# Patient Record
Sex: Male | Born: 1940 | Race: White | Hispanic: No | Marital: Married | Smoking: Never smoker
Health system: Southern US, Community
[De-identification: ages and names within clinical notes are randomized; demographics above are authoritative.]

## PROBLEM LIST (undated history)

## (undated) DIAGNOSIS — K219 Gastro-esophageal reflux disease without esophagitis: Secondary | ICD-10-CM

## (undated) DIAGNOSIS — I219 Acute myocardial infarction, unspecified: Secondary | ICD-10-CM

## (undated) DIAGNOSIS — D6861 Antiphospholipid syndrome: Secondary | ICD-10-CM

## (undated) DIAGNOSIS — I5042 Chronic combined systolic (congestive) and diastolic (congestive) heart failure: Secondary | ICD-10-CM

## (undated) DIAGNOSIS — M1990 Unspecified osteoarthritis, unspecified site: Secondary | ICD-10-CM

## (undated) DIAGNOSIS — R569 Unspecified convulsions: Secondary | ICD-10-CM

## (undated) DIAGNOSIS — I4891 Unspecified atrial fibrillation: Secondary | ICD-10-CM

## (undated) DIAGNOSIS — K5090 Crohn's disease, unspecified, without complications: Secondary | ICD-10-CM

## (undated) DIAGNOSIS — J45909 Unspecified asthma, uncomplicated: Secondary | ICD-10-CM

## (undated) DIAGNOSIS — I82409 Acute embolism and thrombosis of unspecified deep veins of unspecified lower extremity: Secondary | ICD-10-CM

## (undated) DIAGNOSIS — T7840XA Allergy, unspecified, initial encounter: Secondary | ICD-10-CM

## (undated) DIAGNOSIS — I10 Essential (primary) hypertension: Secondary | ICD-10-CM

## (undated) DIAGNOSIS — I639 Cerebral infarction, unspecified: Secondary | ICD-10-CM

## (undated) DIAGNOSIS — H269 Unspecified cataract: Secondary | ICD-10-CM

## (undated) HISTORY — DX: Unspecified atrial fibrillation: I48.91

## (undated) HISTORY — PX: EYE SURGERY: SHX253

## (undated) HISTORY — DX: Gastro-esophageal reflux disease without esophagitis: K21.9

## (undated) HISTORY — PX: SPINE SURGERY: SHX786

## (undated) HISTORY — DX: Unspecified cataract: H26.9

## (undated) HISTORY — PX: HERNIA REPAIR: SHX51

## (undated) HISTORY — DX: Acute myocardial infarction, unspecified: I21.9

## (undated) HISTORY — DX: Cerebral infarction, unspecified: I63.9

## (undated) HISTORY — DX: Allergy, unspecified, initial encounter: T78.40XA

## (undated) HISTORY — PX: BACK SURGERY: SHX140

## (undated) HISTORY — PX: CARDIAC SURGERY: SHX584

## (undated) HISTORY — DX: Unspecified convulsions: R56.9

## (undated) HISTORY — PX: PACEMAKER INSERTION: SHX728

## (undated) HISTORY — DX: Chronic combined systolic (congestive) and diastolic (congestive) heart failure: I50.42

## (undated) HISTORY — PX: TONSILLECTOMY: SUR1361

## (undated) HISTORY — PX: CORONARY ARTERY BYPASS GRAFT: SHX141

---

## 2000-07-18 ENCOUNTER — Emergency Department (HOSPITAL_COMMUNITY): Admission: EM | Admit: 2000-07-18 | Discharge: 2000-07-18 | Payer: Self-pay | Admitting: Emergency Medicine

## 2000-07-19 ENCOUNTER — Emergency Department (HOSPITAL_COMMUNITY): Admission: EM | Admit: 2000-07-19 | Discharge: 2000-07-19 | Payer: Self-pay | Admitting: Emergency Medicine

## 2010-08-30 ENCOUNTER — Ambulatory Visit: Payer: Self-pay | Admitting: Gastroenterology

## 2010-08-30 ENCOUNTER — Emergency Department (HOSPITAL_COMMUNITY): Admission: EM | Admit: 2010-08-30 | Discharge: 2010-08-30 | Payer: Self-pay | Admitting: Family Medicine

## 2010-08-30 ENCOUNTER — Inpatient Hospital Stay (HOSPITAL_COMMUNITY): Admission: EM | Admit: 2010-08-30 | Discharge: 2010-09-06 | Payer: Self-pay | Admitting: Emergency Medicine

## 2010-08-31 ENCOUNTER — Ambulatory Visit: Payer: Self-pay | Admitting: Oncology

## 2010-08-31 LAB — HM COLONOSCOPY

## 2010-09-04 LAB — HM COLONOSCOPY

## 2010-09-05 ENCOUNTER — Ambulatory Visit: Payer: Self-pay | Admitting: Oncology

## 2011-02-25 LAB — CBC
HCT: 34.1 % — ABNORMAL LOW (ref 39.0–52.0)
HCT: 37.9 % — ABNORMAL LOW (ref 39.0–52.0)
Hemoglobin: 11.8 g/dL — ABNORMAL LOW (ref 13.0–17.0)
Hemoglobin: 12.7 g/dL — ABNORMAL LOW (ref 13.0–17.0)
MCH: 33.7 pg (ref 26.0–34.0)
MCHC: 33.6 g/dL (ref 30.0–36.0)
MCHC: 33.7 g/dL (ref 30.0–36.0)
MCHC: 34.5 g/dL (ref 30.0–36.0)
MCV: 100 fL (ref 78.0–100.0)
MCV: 98.3 fL (ref 78.0–100.0)
MCV: 99.7 fL (ref 78.0–100.0)
Platelets: 162 10*3/uL (ref 150–400)
Platelets: 163 10*3/uL (ref 150–400)
Platelets: 178 10*3/uL (ref 150–400)
Platelets: 188 10*3/uL (ref 150–400)
RBC: 3.52 MIL/uL — ABNORMAL LOW (ref 4.22–5.81)
RDW: 13 % (ref 11.5–15.5)
RDW: 13.1 % (ref 11.5–15.5)
RDW: 13.1 % (ref 11.5–15.5)
RDW: 13.3 % (ref 11.5–15.5)
RDW: 13.6 % (ref 11.5–15.5)
WBC: 5.8 10*3/uL (ref 4.0–10.5)
WBC: 5.8 10*3/uL (ref 4.0–10.5)
WBC: 6.3 10*3/uL (ref 4.0–10.5)
WBC: 6.5 10*3/uL (ref 4.0–10.5)

## 2011-02-25 LAB — CROSSMATCH

## 2011-02-25 LAB — HEMOGLOBIN AND HEMATOCRIT, BLOOD
HCT: 35.7 % — ABNORMAL LOW (ref 39.0–52.0)
HCT: 36.3 % — ABNORMAL LOW (ref 39.0–52.0)
HCT: 36.7 % — ABNORMAL LOW (ref 39.0–52.0)
HCT: 36.7 % — ABNORMAL LOW (ref 39.0–52.0)
HCT: 37.3 % — ABNORMAL LOW (ref 39.0–52.0)
HCT: 37.6 % — ABNORMAL LOW (ref 39.0–52.0)
HCT: 38.4 % — ABNORMAL LOW (ref 39.0–52.0)
HCT: 38.9 % — ABNORMAL LOW (ref 39.0–52.0)
Hemoglobin: 11.8 g/dL — ABNORMAL LOW (ref 13.0–17.0)
Hemoglobin: 12.3 g/dL — ABNORMAL LOW (ref 13.0–17.0)
Hemoglobin: 12.5 g/dL — ABNORMAL LOW (ref 13.0–17.0)
Hemoglobin: 12.7 g/dL — ABNORMAL LOW (ref 13.0–17.0)
Hemoglobin: 13.6 g/dL (ref 13.0–17.0)

## 2011-02-25 LAB — BASIC METABOLIC PANEL
BUN: 16 mg/dL (ref 6–23)
BUN: 21 mg/dL (ref 6–23)
BUN: 8 mg/dL (ref 6–23)
Calcium: 7.7 mg/dL — ABNORMAL LOW (ref 8.4–10.5)
Calcium: 8.9 mg/dL (ref 8.4–10.5)
Chloride: 109 mEq/L (ref 96–112)
Chloride: 112 mEq/L (ref 96–112)
Creatinine, Ser: 0.76 mg/dL (ref 0.4–1.5)
Creatinine, Ser: 0.77 mg/dL (ref 0.4–1.5)
Creatinine, Ser: 0.91 mg/dL (ref 0.4–1.5)
GFR calc Af Amer: >60 mL/min (ref >60)
GFR calc non Af Amer: >60 mL/min (ref >60)
GFR calc non Af Amer: >60 mL/min (ref >60)
GFR calc non Af Amer: >60 mL/min (ref >60)
Glucose, Bld: 84 mg/dL (ref 70–99)
Glucose, Bld: 89 mg/dL (ref 70–99)
Potassium: 3.2 mEq/L — ABNORMAL LOW (ref 3.5–5.1)
Potassium: 3.5 mEq/L (ref 3.5–5.1)
Potassium: 3.6 mEq/L (ref 3.5–5.1)
Sodium: 140 mEq/L (ref 135–145)
Sodium: 140 mEq/L (ref 135–145)
Sodium: 143 mEq/L (ref 135–145)

## 2011-02-25 LAB — APTT
aPTT: 34 seconds (ref 24–37)
aPTT: 34 seconds (ref 24–37)
aPTT: 63 seconds — ABNORMAL HIGH (ref 24–37)

## 2011-02-25 LAB — TYPE AND SCREEN

## 2011-02-25 LAB — DIFFERENTIAL
Basophils Absolute: 0 10*3/uL (ref 0.0–0.1)
Basophils Relative: 0 % (ref 0–1)
Eosinophils Absolute: 0.2 10*3/uL (ref 0.0–0.7)
Neutro Abs: 5.2 10*3/uL (ref 1.7–7.7)
Neutrophils Relative %: 71 % (ref 43–77)

## 2011-02-25 LAB — PROTIME-INR
INR: 1.51 — ABNORMAL HIGH (ref 0.00–1.49)
INR: 1.55 — ABNORMAL HIGH (ref 0.00–1.49)
INR: 1.55 — ABNORMAL HIGH (ref 0.00–1.49)
Prothrombin Time: 18.8 seconds — ABNORMAL HIGH (ref 11.6–15.2)

## 2011-02-25 LAB — HEPARIN LEVEL (UNFRACTIONATED)
Heparin Unfractionated: 0.23 IU/mL — ABNORMAL LOW (ref 0.30–0.70)
Heparin Unfractionated: 0.33 IU/mL (ref 0.30–0.70)
Heparin Unfractionated: 1.26 IU/mL — ABNORMAL HIGH (ref 0.30–0.70)
Heparin Unfractionated: <0.1 IU/mL — ABNORMAL LOW (ref 0.30–0.70)

## 2011-02-25 LAB — MRSA PCR SCREENING: MRSA by PCR: NEGATIVE

## 2011-05-11 IMAGING — NM NM GI BLOOD LOSS
2 series · 12 of 12 positions shown · non-contrast
Comparison: None

CLINICAL DATA: Rectal bleeding.  Recent colonoscopy.

NUCLEAR MEDICINE GASTROINTESTINAL BLEEDING STUDY
TECHNIQUE: Sequential abdominal images were obtained following
intravenous administration of Nc-OOm labeled red blood cells.
Radiopharmaceutical: 25.1 mCi technetium labeled red blood cell

[gi gi bleed · 5.01mm/px · 6 of 60 frames shown (1 of 2)]
[frame 6/60]
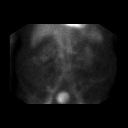
[frame 16/60]
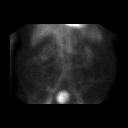
[frame 26/60]
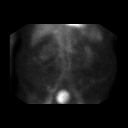
[frame 36/60]
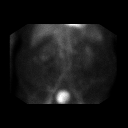
[frame 46/60]
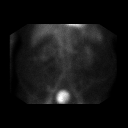
[frame 56/60]
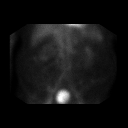

[gi gi bleed · 5.01mm/px · 6 of 60 frames shown (2 of 2)]
[frame 6/60]
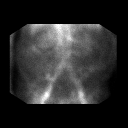
[frame 16/60]
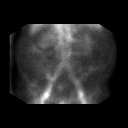
[frame 26/60]
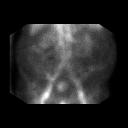
[frame 36/60]
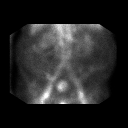
[frame 46/60]
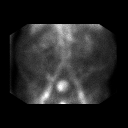
[frame 56/60]
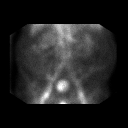

[12 of 12 positions shown; findings below may reference images not displayed]

FINDINGS: No evidence of endoluminal red blood cell bowel activity
over the 2  hour time course.  Physiologic activity is noted within
the abdominal vasculature, kidneys, liver,  spleen and bladder.
IMPRESSION: No evidence of active gastrointestinal bleeding.

## 2023-03-31 DIAGNOSIS — E785 Hyperlipidemia, unspecified: Secondary | ICD-10-CM | POA: Diagnosis present

## 2023-03-31 DIAGNOSIS — N401 Enlarged prostate with lower urinary tract symptoms: Secondary | ICD-10-CM | POA: Diagnosis present

## 2023-04-10 ENCOUNTER — Encounter (HOSPITAL_COMMUNITY): Payer: Self-pay

## 2023-04-10 ENCOUNTER — Inpatient Hospital Stay (HOSPITAL_COMMUNITY)
Admission: EM | Admit: 2023-04-10 | Discharge: 2023-04-15 | DRG: 603 | Disposition: A | Discharge status: Home Health | Payer: Medicare PPO | Attending: Internal Medicine | Admitting: Internal Medicine

## 2023-04-10 ENCOUNTER — Emergency Department (HOSPITAL_COMMUNITY): Payer: Medicare PPO

## 2023-04-10 ENCOUNTER — Emergency Department (HOSPITAL_BASED_OUTPATIENT_CLINIC_OR_DEPARTMENT_OTHER): Payer: Medicare PPO

## 2023-04-10 ENCOUNTER — Ambulatory Visit
Admission: EM | Admit: 2023-04-10 | Discharge: 2023-04-10 | Disposition: A | Discharge status: Home / Self Care | Payer: Medicare PPO

## 2023-04-10 DIAGNOSIS — L03115 Cellulitis of right lower limb: Secondary | ICD-10-CM | POA: Diagnosis not present

## 2023-04-10 DIAGNOSIS — R609 Edema, unspecified: Secondary | ICD-10-CM | POA: Diagnosis not present

## 2023-04-10 DIAGNOSIS — Z8701 Personal history of pneumonia (recurrent): Secondary | ICD-10-CM

## 2023-04-10 DIAGNOSIS — I2510 Atherosclerotic heart disease of native coronary artery without angina pectoris: Secondary | ICD-10-CM | POA: Diagnosis present

## 2023-04-10 DIAGNOSIS — N401 Enlarged prostate with lower urinary tract symptoms: Secondary | ICD-10-CM | POA: Diagnosis not present

## 2023-04-10 DIAGNOSIS — D6861 Antiphospholipid syndrome: Secondary | ICD-10-CM

## 2023-04-10 DIAGNOSIS — Z951 Presence of aortocoronary bypass graft: Secondary | ICD-10-CM

## 2023-04-10 DIAGNOSIS — Z79899 Other long term (current) drug therapy: Secondary | ICD-10-CM

## 2023-04-10 DIAGNOSIS — E785 Hyperlipidemia, unspecified: Secondary | ICD-10-CM

## 2023-04-10 DIAGNOSIS — Z86718 Personal history of other venous thrombosis and embolism: Secondary | ICD-10-CM

## 2023-04-10 DIAGNOSIS — K50919 Crohn's disease, unspecified, with unspecified complications: Secondary | ICD-10-CM | POA: Diagnosis not present

## 2023-04-10 DIAGNOSIS — Z7901 Long term (current) use of anticoagulants: Secondary | ICD-10-CM

## 2023-04-10 DIAGNOSIS — A46 Erysipelas: Secondary | ICD-10-CM | POA: Diagnosis present

## 2023-04-10 DIAGNOSIS — K5090 Crohn's disease, unspecified, without complications: Secondary | ICD-10-CM | POA: Diagnosis present

## 2023-04-10 DIAGNOSIS — I10 Essential (primary) hypertension: Secondary | ICD-10-CM

## 2023-04-10 DIAGNOSIS — Z6833 Body mass index (BMI) 33.0-33.9, adult: Secondary | ICD-10-CM

## 2023-04-10 DIAGNOSIS — J45909 Unspecified asthma, uncomplicated: Secondary | ICD-10-CM | POA: Diagnosis present

## 2023-04-10 DIAGNOSIS — Z888 Allergy status to other drugs, medicaments and biological substances status: Secondary | ICD-10-CM

## 2023-04-10 HISTORY — DX: Unspecified asthma, uncomplicated: J45.909

## 2023-04-10 HISTORY — DX: Unspecified osteoarthritis, unspecified site: M19.90

## 2023-04-10 HISTORY — DX: Crohn's disease, unspecified, without complications: K50.90

## 2023-04-10 HISTORY — DX: Acute embolism and thrombosis of unspecified deep veins of unspecified lower extremity: I82.409

## 2023-04-10 HISTORY — DX: Essential (primary) hypertension: I10

## 2023-04-10 HISTORY — DX: Antiphospholipid syndrome: D68.61

## 2023-04-10 LAB — CBC WITH DIFFERENTIAL/PLATELET
Abs Immature Granulocytes: 0.1 10*3/uL — ABNORMAL HIGH (ref 0.00–0.07)
Basophils Absolute: 0.1 10*3/uL (ref 0.0–0.1)
Basophils Relative: 1 %
Eosinophils Absolute: 0.4 10*3/uL (ref 0.0–0.5)
Eosinophils Relative: 4 %
HCT: 44.9 % (ref 39.0–52.0)
Hemoglobin: 14.1 g/dL (ref 13.0–17.0)
Lymphocytes Relative: 9 %
Lymphs Abs: 0.8 10*3/uL (ref 0.7–4.0)
MCH: 25.4 pg — ABNORMAL LOW (ref 26.0–34.0)
MCHC: 31.4 g/dL (ref 30.0–36.0)
MCV: 80.8 fL (ref 80.0–100.0)
Monocytes Absolute: 0.4 10*3/uL (ref 0.1–1.0)
Monocytes Relative: 5 %
Neutro Abs: 7.1 10*3/uL (ref 1.7–7.7)
Neutrophils Relative %: 80 %
Platelets: 214 10*3/uL (ref 150–400)
Promyelocytes Relative: 1 %
RBC: 5.56 MIL/uL (ref 4.22–5.81)
RDW: 25 % — ABNORMAL HIGH (ref 11.5–15.5)
WBC: 8.9 10*3/uL (ref 4.0–10.5)
nRBC: 0 % (ref 0.0–0.2)
nRBC: 0 /100 WBC

## 2023-04-10 LAB — PROTIME-INR
INR: 1.3 — ABNORMAL HIGH (ref 0.8–1.2)
Prothrombin Time: 15.8 seconds — ABNORMAL HIGH (ref 11.4–15.2)

## 2023-04-10 LAB — COMPREHENSIVE METABOLIC PANEL
ALT: 26 U/L (ref 0–44)
AST: 29 U/L (ref 15–41)
Albumin: 3.4 g/dL — ABNORMAL LOW (ref 3.5–5.0)
Alkaline Phosphatase: 50 U/L (ref 38–126)
Anion gap: 9 (ref 5–15)
BUN: 27 mg/dL — ABNORMAL HIGH (ref 8–23)
CO2: 25 mmol/L (ref 22–32)
Calcium: 9.3 mg/dL (ref 8.9–10.3)
Chloride: 104 mmol/L (ref 98–111)
Creatinine, Ser: 0.83 mg/dL (ref 0.61–1.24)
GFR, Estimated: >60 mL/min (ref >60)
Glucose, Bld: 116 mg/dL — ABNORMAL HIGH (ref 70–99)
Potassium: 4.1 mmol/L (ref 3.5–5.1)
Sodium: 138 mmol/L (ref 135–145)
Total Bilirubin: 1.2 mg/dL (ref 0.3–1.2)
Total Protein: 7.1 g/dL (ref 6.5–8.1)

## 2023-04-10 LAB — BRAIN NATRIURETIC PEPTIDE: B Natriuretic Peptide: 87.7 pg/mL (ref 0.0–100.0)

## 2023-04-10 LAB — LACTIC ACID, PLASMA
Lactic Acid, Venous: 1.8 mmol/L (ref 0.5–1.9)
Lactic Acid, Venous: 1.4 mmol/L (ref 0.5–1.9)

## 2023-04-10 MED ORDER — VANCOMYCIN HCL 2000 MG/400ML IV SOLN
2000.0000 mg | Freq: Once | INTRAVENOUS | Status: AC
  Administered 2023-04-10: 2000 mg via INTRAVENOUS
  Filled 2023-04-10: qty 400

## 2023-04-10 MED ORDER — SODIUM CHLORIDE 0.9 % IV SOLN
2.0000 g | Freq: Three times a day (TID) | INTRAVENOUS | Status: DC
  Administered 2023-04-10 – 2023-04-13 (×8): 2 g via INTRAVENOUS
  Filled 2023-04-10 (×8): qty 12.5

## 2023-04-10 MED ORDER — VANCOMYCIN HCL 1750 MG/350ML IV SOLN
1750.0000 mg | INTRAVENOUS | Status: DC
  Administered 2023-04-11 – 2023-04-12 (×2): 1750 mg via INTRAVENOUS
  Filled 2023-04-10 (×2): qty 350

## 2023-04-10 MED ORDER — BUDESONIDE 3 MG PO CPEP
3.0000 mg | ORAL_CAPSULE | Freq: Two times a day (BID) | ORAL | Status: DC
  Administered 2023-04-11 – 2023-04-14 (×8): 3 mg via ORAL
  Filled 2023-04-10 (×13): qty 1

## 2023-04-10 MED ORDER — METOPROLOL SUCCINATE ER 25 MG PO TB24
25.0000 mg | ORAL_TABLET | Freq: Every day | ORAL | Status: DC
  Administered 2023-04-11 – 2023-04-15 (×5): 25 mg via ORAL
  Filled 2023-04-10 (×5): qty 1

## 2023-04-10 MED ORDER — SODIUM CHLORIDE 0.9 % IV SOLN: 2.0000 g | Freq: Once | INTRAVENOUS | Status: DC

## 2023-04-10 MED ORDER — ATORVASTATIN CALCIUM 40 MG PO TABS
40.0000 mg | ORAL_TABLET | Freq: Every day | ORAL | Status: DC
  Administered 2023-04-11 – 2023-04-15 (×5): 40 mg via ORAL
  Filled 2023-04-10 (×5): qty 1

## 2023-04-10 MED ORDER — TAMSULOSIN HCL 0.4 MG PO CAPS
0.4000 mg | ORAL_CAPSULE | Freq: Every day | ORAL | Status: DC
  Administered 2023-04-11 – 2023-04-15 (×5): 0.4 mg via ORAL
  Filled 2023-04-10 (×5): qty 1

## 2023-04-10 MED ORDER — ACETAMINOPHEN 325 MG PO TABS
650.0000 mg | ORAL_TABLET | Freq: Four times a day (QID) | ORAL | Status: DC | PRN
  Administered 2023-04-14: 650 mg via ORAL
  Filled 2023-04-10 (×2): qty 2

## 2023-04-10 MED ORDER — FUROSEMIDE 40 MG PO TABS
40.0000 mg | ORAL_TABLET | Freq: Every day | ORAL | Status: AC
  Administered 2023-04-13: 40 mg via ORAL
  Filled 2023-04-10 (×3): qty 1

## 2023-04-10 MED ORDER — SPIRONOLACTONE 25 MG PO TABS
25.0000 mg | ORAL_TABLET | Freq: Every day | ORAL | Status: DC
  Administered 2023-04-11 – 2023-04-15 (×5): 25 mg via ORAL
  Filled 2023-04-10 (×5): qty 1

## 2023-04-10 MED ORDER — VANCOMYCIN HCL IN DEXTROSE 1-5 GM/200ML-% IV SOLN: 1000.0000 mg | Freq: Once | INTRAVENOUS | Status: DC

## 2023-04-10 MED ORDER — APIXABAN 5 MG PO TABS
5.0000 mg | ORAL_TABLET | Freq: Two times a day (BID) | ORAL | Status: DC
  Administered 2023-04-10 – 2023-04-15 (×10): 5 mg via ORAL
  Filled 2023-04-10 (×10): qty 1

## 2023-04-10 MED ORDER — OXYCODONE HCL 5 MG PO TABS: 5.0000 mg | ORAL_TABLET | ORAL | Status: DC | PRN

## 2023-04-10 MED ORDER — MONTELUKAST SODIUM 10 MG PO TABS
10.0000 mg | ORAL_TABLET | Freq: Every day | ORAL | Status: DC
  Administered 2023-04-10 – 2023-04-14 (×5): 10 mg via ORAL
  Filled 2023-04-10 (×5): qty 1

## 2023-04-10 MED ORDER — ONDANSETRON HCL 4 MG/2ML IJ SOLN: 4.0000 mg | Freq: Four times a day (QID) | INTRAMUSCULAR | Status: DC | PRN

## 2023-04-10 MED ORDER — PANTOPRAZOLE SODIUM 40 MG PO TBEC
40.0000 mg | DELAYED_RELEASE_TABLET | Freq: Every day | ORAL | Status: DC
  Administered 2023-04-11 – 2023-04-15 (×5): 40 mg via ORAL
  Filled 2023-04-10 (×5): qty 1

## 2023-04-10 MED ORDER — ACETAMINOPHEN 650 MG RE SUPP: 650.0000 mg | Freq: Four times a day (QID) | RECTAL | Status: DC | PRN

## 2023-04-10 MED ORDER — FINASTERIDE 5 MG PO TABS
5.0000 mg | ORAL_TABLET | Freq: Every day | ORAL | Status: DC
  Administered 2023-04-11 – 2023-04-15 (×5): 5 mg via ORAL
  Filled 2023-04-10 (×5): qty 1

## 2023-04-10 MED ORDER — ONDANSETRON HCL 4 MG PO TABS: 4.0000 mg | ORAL_TABLET | Freq: Four times a day (QID) | ORAL | Status: DC | PRN

## 2023-04-10 NOTE — Subjective & Objective (Signed)
CC: right leg cellulitis HPI: 82 year old white male history of hypertension, antiphospholipid antibody syndrome on chronic Eliquis, history of DVT, Crohn's disease, BPH, coronary disease status post CABG presents to the ER today with worsening cellulitis of his right leg.  Patient is from [facility].  Patient had been admitted to the hospital from March 21, 2023 through March 29, 2023.  He was treated for Klebsiella and Serratia pneumonia.  He was placed on IV meropenem at discharge.  He completed 10 additional days of IV meropenem with PICC line.  On the day he stopped his meropenem, he developed cellulitis of his right leg.  He was seen in clinic the next day and started on p.o. Keflex.  Patient had already bought tickets to come visit his daughter who lives here in [place] and decided come here despite having cellulitis of his leg.  He has noted since arriving to [place] few days ago that his cellulitis is continue to worsen.  He has been having low-grade fevers at home.  Increasing swelling and erythema.  Patient brought to the ER today by his daughter.  On arrival temp 97.8 heart rate 65 blood pressure 135/73 satting 95% on room air.  White count 8.9, hemoglobin 14.1, platelets of 214  Sodium 138, potassium 4.1, chloride 104, BUN 27, creatinine 0.8, glucose 116  Lactic acid normal 1.4  BNP of 87.7  X-ray of the right lower extremity demonstrated soft tissue swelling.  No osseous abnormality.  Lower extremity ultrasounds were negative for DVT.  [facility] hospital[facility] contacted for admission.

## 2023-04-10 NOTE — Progress Notes (Signed)
VASCULAR LAB    Right lower extremty venous duplex has been performed.  See CV proc for preliminary results.  Gave verbal report to Dr. Rondel Oh, [facility], RVT 04/10/2023, 6:52 PM

## 2023-04-10 NOTE — Assessment & Plan Note (Signed)
Continue flomax 0.4mg daily.  ?

## 2023-04-10 NOTE — ED Provider Notes (Signed)
Patient redirected to the emergency room through triage process as he is failing outpatient management for severe cellulitis.    Wallis Bamberg, [place] 04/10/23 1545

## 2023-04-10 NOTE — Assessment & Plan Note (Signed)
Chronic. BMI 33

## 2023-04-10 NOTE — Progress Notes (Signed)
Pharmacy Antibiotic Note  Brent Arnold is a 82 y.o. male for which pharmacy has been consulted for cefepime and vancomycin dosing for cellulitis.  Patient with a history of HTN, antiphospholipid antibody on eliquis, hx of DVT, Crohn's, BPH, CAD s/p CABG. Patient presenting with worsening cellulitis of rt leg.  SCr 0.83 WBC 8.9; LA 1.4; T 97.6; HR 64; RR 20  Plan: Cefepime 2g q8hr  Vancomycin 2000 mg once then 1750 mg q24hr (eAUC 514.4) unless change in renal function Trend WBC, Fever, Renal function F/u cultures, clinical course, WBC De-escalate when able Levels at steady state  Height: 5\' 10"  (177.8 cm) Weight: 104.3 kg (230 lb) IBW/kg (Calculated) : 73  Temp (24hrs), Avg:97.7 F (36.5 C), Min:97.6 F (36.4 C), Max:97.8 F (36.6 C)  Recent Labs  Lab 04/10/23 1652 04/10/23 1705 04/10/23 1852  WBC 8.9  --   --   CREATININE 0.83  --   --   LATICACIDVEN  --  1.8 1.4    Estimated Creatinine Clearance: 85.8 mL/min (by C-G formula based on SCr of 0.83 mg/dL).    Allergies  Allergen Reactions   Dilantin [Phenytoin]    Gabapentin    Microbiology results: Pending  Thank you for allowing pharmacy to be a part of this patient's care.  Delmar Landau, PharmD, BCPS 04/10/2023 9:32 PM ED Clinical Pharmacist -  [phone]

## 2023-04-10 NOTE — Assessment & Plan Note (Signed)
Observation med/surg bed. Has failed outpatient keflex. Check MRSA screen. Pt is from [place], [place]. Would be better to find oral regimen so he can go home instead of IV abx. He had completed his 10 days of IV meropenem the day his right leg cellulitis started. Check ASO titer. LE U/S negative for DVT.

## 2023-04-10 NOTE — Assessment & Plan Note (Signed)
Stable

## 2023-04-10 NOTE — ED Triage Notes (Addendum)
Pt was dx with RLE cellulitis 5 days ago-taking keflex x 4 days-feels area is worse-RLE red/swelling from knee down-NAD-steady gait with own rollator-pt and daughter advised of need to go to ED-pleasantly agreed-daughter states she started to take pt to ED prior to coming to [facility]

## 2023-04-10 NOTE — ED Triage Notes (Addendum)
Pt being treated with Keflex for possible cellulitis, currently on day four of treatment with minimal improvement. Pt has edema and redness to the right lower extremity. Right pedal pulse intact. Marking for boarder of infection have been surpassed. Pt reports there was drainage to site that has since stopped. Pt alert and oriented speaking in full sentences.   Recent back surgery, COVID PNA, and multiple ABX use within the past month.

## 2023-04-10 NOTE — H&P (Signed)
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  Patient is from [facility].  Patient had been admitted to the hospital from March 21, 2023 through March 29, 2023.  He was treated for Klebsiella and Serratia pneumonia.  He was placed on IV meropenem at discharge.  He completed 10 additional days of IV meropenem with PICC line.  On the day he stopped his meropenem, he developed cellulitis of his right leg.  He was seen in clinic the next day and started on p.o. Keflex.  Patient had already bought tickets to come visit his daughter who lives here in [place] and decided come here despite having cellulitis of his leg.  He has noted since arriving to [place] few days ago that his cellulitis is continue to worsen.  He has been having low-grade fevers at home.  Increasing swelling and erythema.  Patient brought to the ER today by his daughter.  On arrival temp 97.8 heart rate 65 blood pressure 135/73 satting 95% on room air.  White count 8.9, hemoglobin 14.1, platelets of 214  Sodium 138, potassium 4.1, chloride 104, BUN 27, creatinine 0.8, glucose 116  Lactic acid normal 1.4  BNP of 87.7  X-ray of the right lower extremity demonstrated soft tissue swelling.  No osseous abnormality.  Lower extremity ultrasounds were negative for DVT.  [facility] hospitalist contacted for admission.   ED Course: WBC 8.9, lactic acid 1.4  Review of Systems:  Review of Systems  Constitutional:  Positive for fever.   HENT: Negative.    Eyes: Negative.   Respiratory: Negative.    Cardiovascular:  Positive for leg swelling.       Has had swelling of his legs since he was discharged from the hospital in the middle of April.  has been taking Lasix at home.  Wife states that swelling is improved with Lasix therapy.  Gastrointestinal: Negative.   Genitourinary: Negative.   Musculoskeletal: Negative.   Skin:        Worsening erythema, swelling of his right leg  Neurological: Negative.   Endo/Heme/Allergies: Negative.   Psychiatric/Behavioral: Negative.    All other systems reviewed and are negative.   Past Medical History:  Diagnosis Date   Antiphospholipid antibody syndrome (HCC)    Arthritis    Asthma    Crohn disease (HCC)    DVT (deep venous thrombosis) (HCC)    Hypertension     Past Surgical History:  Procedure Laterality Date   BACK SURGERY     CARDIAC SURGERY     CORONARY ARTERY BYPASS GRAFT     2001   HERNIA REPAIR       reports that he has never smoked. He has never used smokeless tobacco. He reports that he does not drink alcohol and does not use drugs.  Allergies  Allergen Reactions   Dilantin [Phenytoin]    Gabapentin     History reviewed. No pertinent family history.  Prior to Admission medications   Not on File  Current Outpatient Medications  Medication Sig Dispense Refill  apixaban (ELIQUIS) 5 mg Tab Take 1 tablet (5 mg total) by mouth 2 (two) times daily.  atorvastatin (LIPITOR) 40 MG tablet Take 1 tablet (40 mg total) by mouth daily. 90 tablet 3  budesonide (ENTOCORT EC) 3 mg 24 hr capsule TAKE 1 CAPSULE TWICE DAILY 180 capsule 1  cholecalciferol (VITAMIN D3) 125 mcg (5,000 unit) capsule Take 1 capsule (5,000 Units total) by mouth daily. 30 capsule 0  cyanocobalamin (VITAMIN B12) 1,000 mcg/mL injection INJECT 1 ML ( 1,000 MCG TOTAL ) INTO THE MUSCLE ONCE A WEEK. 12 mL 1  dextromethorphan-guaiFENesin (ROBITUSSIN-DM) 10-100 mg/5 mL liquid Take 10 mLs (20 mg total)  by mouth every 6 (six) hours as needed. 236 mL 0  esomeprazole (NexIUM) 40 MG capsule TAKE 1 CAPSULE EVERY MORNING BEFORE BREAKFAST 90 capsule 0  finasteride (PROSCAR) 5 mg tablet Take 1 tablet (5 mg total) by mouth at bedtime. 30 tablet 5  fluticasone furoate-vilanteroL (BREO ELLIPTA) 200-25 mcg/dose inhalation powder Inhale 1 puff into the lungs daily. 1 each 11  furosemide (LASIX) 40 MG tablet Take 1 tablet (40 mg total) by mouth daily as needed.  ipratropium-albuteroL (COMBIVENT) 20-100 mcg/actuation inhaler Inhale 1 puff into the lungs 4 (four) times daily as needed. 12 g 3  ipratropium-albuteroL (DuoNeb) 0.5 mg-3 mg(2.5 mg base)/3 mL nebulizer solution Inhale 3 mLs by nebulization every 4 (four) hours as needed. 720 mL 0  LOPERAMIDE HCL (IMODIUM A-D ORAL) Take 1 Dose by mouth daily as needed.  meropenem (MERREM) IVPB Inject 1 g into the vein every 8 (eight) hours for 8 days. Pharmacist to determine appropriate diluent and volume  metoprolol succinate (TOPROL-XL) 50 MG 24 hr tablet Take 0.5 tablets (25 mg total) by mouth at bedtime. 45 tablet 3  montelukast (SINGULAIR) 10 mg tablet TAKE 1 TABLET AT BEDTIME 90 tablet 1  nitroglycerin (NITROSTAT) 0.4 MG SL tablet Place 1 tablet (0.4 mg total) under the tongue every 5 (five) minutes as needed. 25 tablet 5  potassium chloride (KLOR-CON) 10 MEQ ER tablet Take 1 tablet (10 mEq total) by mouth daily as needed (with lasix).  spironolactone (ALDACTONE) 25 MG tablet Take 1 tablet (25 mg total) by mouth daily. 90 tablet 3  syringe with needle (BD Luer-Lok Syringe) 3 mL 25 gauge x 1" Syrg USE ONCE A WEEK AS DIRECTED 12 each 3  syringe with needle 3 mL 21 gauge x 1" Syrg Dispense 1 box; use needle to inject depo testosterone every week as directed 1 each PRN  tamsulosin (FLOMAX) 0.4 mg Cap Take 1 capsule (0.4 mg total) by mouth daily. Take 30 minutes after the same meal. 30 capsule 5  testosterone cypionate (DEPO-TESTOSTERONE) 200 mg/mL injection Inject 0.5  mLs (100 mg total) into the muscle once a week. 6 mL 1  vitamins A,C,E-zinc-copper (I-VITE) 2,148 mcg-113 mg-45 mg-17.4mg  Tab Take 2 tablets by mouth daily. 60 tablet 0   Physical Exam: Vitals:   04/10/23 1633 04/10/23 1645  BP: 135/73   Pulse: 65   Resp: 18   Temp: 97.8 F (36.6 C)   TempSrc: Oral   SpO2: 95%   Weight:  104.3 kg  Height:  5\' 10"  (1.778 m)    Physical Exam Vitals and nursing note reviewed.  Constitutional:      General: He is not in acute distress.    Appearance: He is obese. He is not toxic-appearing.  HENT:     Head: Normocephalic and atraumatic.     Nose: Nose normal.  Eyes:  General: No scleral icterus. Cardiovascular:     Rate and Rhythm: Normal rate and regular rhythm.     Pulses: Normal pulses.  Pulmonary:     Effort: Pulmonary effort is normal.     Breath sounds: Normal breath sounds.  Abdominal:     General: Abdomen is protuberant. Bowel sounds are normal. There is no distension.     Palpations: Abdomen is soft.     Tenderness: There is no abdominal tenderness.  Musculoskeletal:     Right lower leg: Edema present.     Left lower leg: Edema present.  Skin:    General: Skin is warm.     Capillary Refill: Capillary refill takes less than 2 seconds.     Findings: Erythema present.     Comments: See picture  Neurological:     General: No focal deficit present.     Mental Status: He is alert and oriented to person, place, and time.         Labs on Admission: I have personally reviewed following labs and imaging studies  CBC: Recent Labs  Lab 04/10/23 1652  WBC 8.9  NEUTROABS 7.1  HGB 14.1  HCT 44.9  MCV 80.8  PLT 214   Basic Metabolic Panel: Recent Labs  Lab 04/10/23 1652  NA 138  K 4.1  CL 104  CO2 25  GLUCOSE 116*  BUN 27*  CREATININE 0.83  CALCIUM 9.3   GFR: Estimated Creatinine Clearance: 85.8 mL/min (by C-G formula based on SCr of 0.83 mg/dL). Liver Function Tests: Recent Labs  Lab 04/10/23 1652  AST 29   ALT 26  ALKPHOS 50  BILITOT 1.2  PROT 7.1  ALBUMIN 3.4*   Coagulation Profile: Recent Labs  Lab 04/10/23 1652  INR 1.3*   BNP (last 3 results) Recent Labs    04/10/23 1652  BNP 87.7    Radiological Exams on Admission: I have personally reviewed images VAS [place] LOWER EXTREMITY VENOUS (DVT) (ONLY [facility] & [facility])  Result Date: 04/10/2023  Lower Venous DVT Study Patient Name:  KAREEM CATHEY  Date of Exam:   04/10/2023 Medical Rec #: [id number]       Accession #:    [id number] Date of Birth: 03-10-1941       Patient Gender: M Patient Age:   10 years Exam Location:  [facility] Procedure:      VAS [place] LOWER EXTREMITY VENOUS (DVT) Referring Phys: Lorenda Cahill HENDERLY --------------------------------------------------------------------------------  Indications: Worsening pain, edema, erythema. Recently diagnosed with cellulitis at outside facility.  Comparison Study: No prior study on file at [facility]. However, most recent                   right LEV done at [facility] 04/07/2023 was negative for                   DVT. Performing Technologist: Sherren Kerns RVS  Examination Guidelines: A complete evaluation includes B-mode imaging, spectral Doppler, color Doppler, and power Doppler as needed of all accessible portions of each vessel. Bilateral testing is considered an integral part of a complete examination. Limited examinations for reoccurring indications may be performed as noted. The reflux portion of the exam is performed with the patient in reverse Trendelenburg.  +---------+---------------+---------+-----------+----------+--------------+ RIGHT    CompressibilityPhasicitySpontaneityPropertiesThrombus Aging +---------+---------------+---------+-----------+----------+--------------+ CFV      Full           Yes      Yes                                 +---------+---------------+---------+-----------+----------+--------------+  SFJ      Full                                                         +---------+---------------+---------+-----------+----------+--------------+ FV Prox  Full                                                        +---------+---------------+---------+-----------+----------+--------------+ FV Mid   Full           Yes      Yes                                 +---------+---------------+---------+-----------+----------+--------------+ FV DistalFull                                                        +---------+---------------+---------+-----------+----------+--------------+ PFV      Full                                                        +---------+---------------+---------+-----------+----------+--------------+ POP      Full           Yes      Yes                                 +---------+---------------+---------+-----------+----------+--------------+ PTV      Full                                                        +---------+---------------+---------+-----------+----------+--------------+ PERO     Full                                                        +---------+---------------+---------+-----------+----------+--------------+   +----+---------------+---------+-----------+----------+--------------+ LEFTCompressibilityPhasicitySpontaneityPropertiesThrombus Aging +----+---------------+---------+-----------+----------+--------------+ CFV Full           Yes      Yes                                 +----+---------------+---------+-----------+----------+--------------+     Summary: RIGHT: - There is no evidence of deep vein thrombosis in the lower extremity.  LEFT: - No evidence of common femoral vein obstruction.  *See table(s) above for measurements and observations.    Preliminary    DG Tibia/Fibula Right  Result Date: 04/10/2023 CLINICAL DATA:  Possible infection EXAM: RIGHT TIBIA AND  FIBULA - 2 VIEW COMPARISON:  None. FINDINGS: There is diffuse soft tissue swelling and edema throughout the  lower extremity. There is no evidence for foreign body or obvious gas collection. Peripheral vascular calcifications are present. There is no acute fracture or dislocation. No cortical erosions are identified. Sequelae of old trauma noted at the level of the medial malleolus. IMPRESSION: 1. Diffuse soft tissue swelling and edema throughout the lower extremity. 2. No acute osseous abnormality. Electronically Signed   By: Darliss Cheney M.D.   On: 04/10/2023 18:10    EKG: My personal interpretation of EKG shows: no EKG to review  Assessment/Plan Principal Problem:   Cellulitis of right leg Active Problems:   Antiphospholipid antibody syndrome (HCC)   Hypertension   Crohn disease (HCC)   Benign prostatic hyperplasia with lower urinary tract symptoms   Hyperlipidemia, unspecified   Morbid (severe) obesity due to excess calories (HCC)    Assessment and Plan: * Cellulitis of right leg Observation med/surg bed. Has failed outpatient keflex. Check MRSA screen. Pt is from [place], [place]. Would be better to find oral regimen so he can go home instead of IV abx. He had completed his 10 days of IV meropenem the day his right leg cellulitis started. Check ASO titer. LE U/S negative for DVT.  Morbid (severe) obesity due to excess calories (HCC) Chronic. BMI 33  Hyperlipidemia, unspecified Stable.  Benign prostatic hyperplasia with lower urinary tract symptoms Continue flomax 0.4 mg daily.  Crohn disease (HCC) Stable.  Hypertension Stable. Continue Toprol-XL 25 mg daily.  Antiphospholipid antibody syndrome (HCC) Chronic. Continue Eliquis.   DVT prophylaxis: Eliquis Code Status: Full Code Family Communication: discussed with pt, wife Daralee, dtr Christy Disposition Plan: return home  Consults called: none  Admission status: Observation, Med-Surg   Carollee Herter, DO [facility] 04/10/2023, 9:21 PM

## 2023-04-10 NOTE — Assessment & Plan Note (Signed)
Stable.  Continue Toprol XL 25mg daily

## 2023-04-10 NOTE — ED Provider Notes (Signed)
[place] EMERGENCY DEPARTMENT AT [facility] Provider Note   CSN: [id number] Arrival date & time: 04/10/23  1609    History  Chief Complaint  Patient presents with   Cellulitis    Shahzain Kiester is a 82 y.o. male hx of hypertension, asthma, prior DVT, multiple recent admissions in 2024 here for evaluation of possible cellulitis.  Sounds like he had an extensive inpatient admission for pneumonia.  He was DC home on IV meropenem for 10 days.  After completing meropenem that day he noted swelling and redness to his right lower extremity. Ended 4/24 Wed. Seen at OSH on 4/25, [place] neg for DVT. Not sent home on ABX, Saw PCP next day dc home on Kelfex.  Has had prior extensive DVT requiring surgical intervention.  States he has reduced circulation at baseline and some chronic swelling however swelling and redness right lower extremity significantly worse than baseline.  His wife has been outlining his leg which has significantly progressed.  No fevers.  States area has been warm and there have been some areas that have been slightly draining, not currently.  No recent falls or injuries.  He is on chronic anticoagulation. No CP, SOB.  HPI     Home Medications Prior to Admission medications   Not on File      Allergies    Dilantin [phenytoin] and Gabapentin    Review of Systems   Review of Systems  Constitutional: Negative.   HENT: Negative.    Eyes: Negative.   Respiratory: Negative.    Cardiovascular: Negative.   Gastrointestinal: Negative.   Genitourinary: Negative.   Musculoskeletal:        Right leg swelling, redness  Neurological: Negative.   All other systems reviewed and are negative.  Physical Exam Updated Vital Signs BP 124/70   Pulse 69   Temp 97.9 F (36.6 C)   Resp 16   Ht 5\' 10"  (1.778 m)   Wt 104.3 kg   SpO2 96%   BMI 33.00 kg/m  Physical Exam Vitals and nursing note reviewed.  Constitutional:      General: He is not in acute distress.     Appearance: He is well-developed. He is not ill-appearing, toxic-appearing or diaphoretic.  HENT:     Head: Atraumatic.     Nose: Nose normal.     Mouth/Throat:     Mouth: Mucous membranes are moist.  Eyes:     Pupils: Pupils are equal, round, and reactive to light.  Cardiovascular:     Rate and Rhythm: Normal rate and regular rhythm.     Pulses:          Dorsalis pedis pulses are detected w/ Doppler on the right side.       Posterior tibial pulses are detected w/ Doppler on the right side.     Heart sounds: Normal heart sounds.     Comments: Doppler pulse to right lower extremity 2/2/ edema Pulmonary:     Effort: Pulmonary effort is normal. No respiratory distress.     Breath sounds: Normal breath sounds.  Abdominal:     General: Bowel sounds are normal. There is no distension.     Palpations: Abdomen is soft.  Musculoskeletal:        General: Normal range of motion.     Cervical back: Normal range of motion and neck supple.     Comments: No bony tenderness, full range of motion, compartment soft.  Bilateral pitting edema right greater than left  Skin:    General: Skin is warm and dry.     Capillary Refill: Capillary refill takes less than 2 seconds.     Findings: Erythema and rash present.     Comments: Erythema, mild warmth to right lower extremity.  No vesicles.  No obvious hemorrhagic bulla.  No petechiae, purpura  Neurological:     General: No focal deficit present.     Mental Status: He is alert and oriented to person, place, and time.        ED Results / Procedures / Treatments   Labs (all labs ordered are listed, but only abnormal results are displayed) Labs Reviewed  COMPREHENSIVE METABOLIC PANEL - Abnormal; Notable for the following components:      Result Value   Glucose, Bld 116 (*)    BUN 27 (*)    Albumin 3.4 (*)    All other components within normal limits  CBC WITH DIFFERENTIAL/PLATELET - Abnormal; Notable for the following components:   MCH 25.4 (*)     RDW 25.0 (*)    Abs Immature Granulocytes 0.10 (*)    All other components within normal limits  PROTIME-INR - Abnormal; Notable for the following components:   Prothrombin Time 15.8 (*)    INR 1.3 (*)    All other components within normal limits  CULTURE, BLOOD (ROUTINE X 2)  CULTURE, BLOOD (ROUTINE X 2)  LACTIC ACID, PLASMA  LACTIC ACID, PLASMA  BRAIN NATRIURETIC PEPTIDE    EKG None  Radiology VAS [place] LOWER EXTREMITY VENOUS (DVT) (ONLY [facility] & [facility])  Result Date: 04/10/2023  Lower Venous DVT Study Patient Name:  MAK BONNY  Date of Exam:   04/10/2023 Medical Rec #: [id number]       Accession #:    [id number] Date of Birth: 05-19-1941       Patient Gender: M Patient Age:   28 years Exam Location:  [facility] Procedure:      VAS [place] LOWER EXTREMITY VENOUS (DVT) Referring Phys: Arch Methot --------------------------------------------------------------------------------  Indications: Worsening pain, edema, erythema. Recently diagnosed with cellulitis at outside facility.  Comparison Study: No prior study on file at [facility]. However, most recent                   right LEV done at [facility] 04/07/2023 was negative for                   DVT. Performing Technologist: Sherren Kerns RVS  Examination Guidelines: A complete evaluation includes B-mode imaging, spectral Doppler, color Doppler, and power Doppler as needed of all accessible portions of each vessel. Bilateral testing is considered an integral part of a complete examination. Limited examinations for reoccurring indications may be performed as noted. The reflux portion of the exam is performed with the patient in reverse Trendelenburg.  +---------+---------------+---------+-----------+----------+--------------+ RIGHT    CompressibilityPhasicitySpontaneityPropertiesThrombus Aging +---------+---------------+---------+-----------+----------+--------------+ CFV      Full           Yes      Yes                                  +---------+---------------+---------+-----------+----------+--------------+ SFJ      Full                                                        +---------+---------------+---------+-----------+----------+--------------+  FV Prox  Full                                                        +---------+---------------+---------+-----------+----------+--------------+ FV Mid   Full           Yes      Yes                                 +---------+---------------+---------+-----------+----------+--------------+ FV DistalFull                                                        +---------+---------------+---------+-----------+----------+--------------+ PFV      Full                                                        +---------+---------------+---------+-----------+----------+--------------+ POP      Full           Yes      Yes                                 +---------+---------------+---------+-----------+----------+--------------+ PTV      Full                                                        +---------+---------------+---------+-----------+----------+--------------+ PERO     Full                                                        +---------+---------------+---------+-----------+----------+--------------+   +----+---------------+---------+-----------+----------+--------------+ LEFTCompressibilityPhasicitySpontaneityPropertiesThrombus Aging +----+---------------+---------+-----------+----------+--------------+ CFV Full           Yes      Yes                                 +----+---------------+---------+-----------+----------+--------------+     Summary: RIGHT: - There is no evidence of deep vein thrombosis in the lower extremity.  LEFT: - No evidence of common femoral vein obstruction.  *See table(s) above for measurements and observations.    Preliminary    DG Tibia/Fibula Right  Result Date: 04/10/2023 CLINICAL DATA:   Possible infection EXAM: RIGHT TIBIA AND FIBULA - 2 VIEW COMPARISON:  None. FINDINGS: There is diffuse soft tissue swelling and edema throughout the lower extremity. There is no evidence for foreign body or obvious gas collection. Peripheral vascular calcifications are present. There is no acute fracture or dislocation. No cortical erosions are identified. Sequelae of old trauma noted at the level of the medial malleolus. IMPRESSION: 1. Diffuse  soft tissue swelling and edema throughout the lower extremity. 2. No acute osseous abnormality. Electronically Signed   By: Darliss Cheney M.D.   On: 04/10/2023 18:10    Procedures Procedures    Medications Ordered in ED Medications  ceFEPIme (MAXIPIME) 2 g in sodium chloride 0.9 % 100 mL IVPB (2 g Intravenous New Bag/Given 04/10/23 2144)  vancomycin (VANCOREADY) IVPB 2000 mg/400 mL (has no administration in time range)    Followed by  vancomycin (VANCOREADY) IVPB 1750 mg/350 mL (has no administration in time range)   ED Course/ Medical Decision Making/ A&P    82 year old male with above medical problems, chronic anticoagulated, recent multiple hospital admissions at OSH here for evaluation of possible cellulitis.  Sounds like he recently completed IV meropenem for pneumonia.  Completed this on Wednesday.  After completion of this noted redness and swelling to his right lower extremity which subsequently worsened.  Was seen at outside hospital had negative DVT ultrasound however was not started on antibiotics, saw PCP the next day started on Keflex.  He has had extension of his erythema despite being on Keflex.  He has no systemic symptoms.  He is afebrile, nonseptic, not ill-appearing.  He is neurovascularly intact.  He does have extensive what appears to be cellulitis to his right lower extremity.  No hemorrhagic bulla.  No petechiae or purpura.  I will suspicion for vasculitis.  He has no rashes to palms or soles to suggest SJS, TEN, TTS. No recent insect  bites.  No crepitus on exam to suggest gas-forming organisms such as necrotizing fasciitis.  He has no fluctuance or induration to suggest abscess.  Full range of motion at joints low suspicion for septic joint.  Labs and Imaging personally viewed and interpreted:  CBC without leukocytosis Metabolic panel without significant abnormality BNP 87 Lactate 1.4 Blood cultures pending INR 1.3 Ultrasound negative for DVT Xray tib fib without acute fx, gas, osteomyelitis  I discussed with patient, family in room.  Will admit for suspected cellulitis.  CONSULT with Dr. Imogene Burn with [facility] who is agreeable to evaluate patient for admission  The patient appears reasonably stabilized for admission considering the current resources, flow, and capabilities available in the ED at this time, and I doubt any other [facility] requiring further screening and/or treatment in the ED prior to admission.                               Medical Decision Making Amount and/or Complexity of Data Reviewed Independent Historian: spouse    Details: Spouse, daughter External Data Reviewed: labs, radiology, ECG and notes. Labs: ordered. Decision-making details documented in ED Course. Radiology: ordered and independent interpretation performed. Decision-making details documented in ED Course. ECG/medicine tests: ordered and independent interpretation performed. Decision-making details documented in ED Course.  Risk OTC drugs. Prescription drug management. Parenteral controlled substances. Decision regarding hospitalization.          Final Clinical Impression(s) / ED Diagnoses Final diagnoses:  Cellulitis of right lower extremity    Rx / DC Orders ED Discharge Orders     None         Kinsey Karch A, PA-C 04/10/23 2155    Gwyneth Sprout, MD 04/12/23 2335

## 2023-04-10 NOTE — Assessment & Plan Note (Signed)
Chronic. Continue Eliquis.

## 2023-04-11 ENCOUNTER — Encounter (HOSPITAL_COMMUNITY): Payer: Self-pay | Admitting: Internal Medicine

## 2023-04-11 ENCOUNTER — Other Ambulatory Visit: Payer: Self-pay

## 2023-04-11 DIAGNOSIS — N401 Enlarged prostate with lower urinary tract symptoms: Secondary | ICD-10-CM | POA: Diagnosis present

## 2023-04-11 DIAGNOSIS — R609 Edema, unspecified: Secondary | ICD-10-CM | POA: Diagnosis present

## 2023-04-11 DIAGNOSIS — Z6833 Body mass index (BMI) 33.0-33.9, adult: Secondary | ICD-10-CM | POA: Diagnosis not present

## 2023-04-11 DIAGNOSIS — A46 Erysipelas: Secondary | ICD-10-CM | POA: Diagnosis present

## 2023-04-11 DIAGNOSIS — Z79899 Other long term (current) drug therapy: Secondary | ICD-10-CM | POA: Diagnosis not present

## 2023-04-11 DIAGNOSIS — L03115 Cellulitis of right lower limb: Principal | ICD-10-CM

## 2023-04-11 DIAGNOSIS — I10 Essential (primary) hypertension: Secondary | ICD-10-CM | POA: Diagnosis present

## 2023-04-11 DIAGNOSIS — Z951 Presence of aortocoronary bypass graft: Secondary | ICD-10-CM | POA: Diagnosis not present

## 2023-04-11 DIAGNOSIS — I2510 Atherosclerotic heart disease of native coronary artery without angina pectoris: Secondary | ICD-10-CM | POA: Diagnosis present

## 2023-04-11 DIAGNOSIS — K5090 Crohn's disease, unspecified, without complications: Secondary | ICD-10-CM | POA: Diagnosis present

## 2023-04-11 DIAGNOSIS — K50919 Crohn's disease, unspecified, with unspecified complications: Secondary | ICD-10-CM | POA: Diagnosis not present

## 2023-04-11 DIAGNOSIS — E785 Hyperlipidemia, unspecified: Secondary | ICD-10-CM | POA: Diagnosis present

## 2023-04-11 DIAGNOSIS — Z888 Allergy status to other drugs, medicaments and biological substances status: Secondary | ICD-10-CM | POA: Diagnosis not present

## 2023-04-11 DIAGNOSIS — Z7901 Long term (current) use of anticoagulants: Secondary | ICD-10-CM | POA: Diagnosis not present

## 2023-04-11 DIAGNOSIS — Z8701 Personal history of pneumonia (recurrent): Secondary | ICD-10-CM | POA: Diagnosis not present

## 2023-04-11 DIAGNOSIS — D6861 Antiphospholipid syndrome: Secondary | ICD-10-CM | POA: Diagnosis present

## 2023-04-11 DIAGNOSIS — Z86718 Personal history of other venous thrombosis and embolism: Secondary | ICD-10-CM | POA: Diagnosis not present

## 2023-04-11 DIAGNOSIS — I7090 Unspecified atherosclerosis: Secondary | ICD-10-CM | POA: Diagnosis not present

## 2023-04-11 DIAGNOSIS — J45909 Unspecified asthma, uncomplicated: Secondary | ICD-10-CM | POA: Diagnosis present

## 2023-04-11 LAB — CBC WITH DIFFERENTIAL/PLATELET
Abs Immature Granulocytes: 0.1 10*3/uL — ABNORMAL HIGH (ref 0.00–0.07)
Basophils Absolute: 0.1 10*3/uL (ref 0.0–0.1)
Basophils Relative: 1 %
Eosinophils Absolute: 0.3 10*3/uL (ref 0.0–0.5)
Eosinophils Relative: 4 %
HCT: 42.4 % (ref 39.0–52.0)
Hemoglobin: 12.7 g/dL — ABNORMAL LOW (ref 13.0–17.0)
Immature Granulocytes: 1 %
Lymphocytes Relative: 7 %
Lymphs Abs: 0.6 10*3/uL — ABNORMAL LOW (ref 0.7–4.0)
MCH: 25.1 pg — ABNORMAL LOW (ref 26.0–34.0)
MCHC: 30 g/dL (ref 30.0–36.0)
MCV: 83.8 fL (ref 80.0–100.0)
Monocytes Absolute: 0.8 10*3/uL (ref 0.1–1.0)
Monocytes Relative: 9 %
Neutro Abs: 6.8 10*3/uL (ref 1.7–7.7)
Neutrophils Relative %: 78 %
Platelets: 200 10*3/uL (ref 150–400)
RBC: 5.06 MIL/uL (ref 4.22–5.81)
RDW: 25.1 % — ABNORMAL HIGH (ref 11.5–15.5)
WBC: 8.7 10*3/uL (ref 4.0–10.5)
nRBC: 0 % (ref 0.0–0.2)

## 2023-04-11 LAB — CULTURE, BLOOD (ROUTINE X 2): Culture: NO GROWTH

## 2023-04-11 LAB — MAGNESIUM: Magnesium: 2.1 mg/dL (ref 1.7–2.4)

## 2023-04-11 NOTE — Progress Notes (Signed)
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   Patient is from [place], [place].  Patient had been admitted to the hospital from March 21, 2023 through March 29, 2023.  He was treated for Klebsiella and Serratia pneumonia.  He was placed on IV meropenem at discharge.  He completed 10 additional days of IV meropenem with PICC line.  On the day he stopped his meropenem, he developed cellulitis of his right leg.  He was seen in clinic the next day and started on p.o. Keflex.  Patient had already bought tickets to come visit his daughter who lives here in [place] . He has noted since arriving to [facility] his cellulitis had continued to worsen.  He has been having low-grade fevers at home.  Increasing swelling and erythema.  Patient brought to the ER by his daughter.   He also had recent right lower extremity duplex on 04/07/2023 in [place] which was negative for DVT. He was started on vancomycin/cefepime on admission.  Interval History:  Resting in bed when seen this morning.  Still having redness and swelling in right leg.  The redness does show some signs of improvement since admission although still very present. Discussed we will continue IV antibiotics through today and reexamine tomorrow.  Assessment and Plan: * Cellulitis of right leg - Patient was on Keflex for approximately 4 days outpatient with no improvement.  Reasonable to continue IV antibiotics and monitor for some improvement before transitioning back to oral regimen.  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 BMI 33  Hyperlipidemia, unspecified Stable.  Benign prostatic hyperplasia with lower urinary tract symptoms Continue flomax 0.4 mg daily.  Crohn disease (HCC) Stable.  Hypertension Stable. Continue Toprol-XL 25 mg daily.  Antiphospholipid antibody syndrome (HCC) Chronic. Continue Eliquis.   Old records reviewed in assessment of this patient  Antimicrobials: Vanc 4/28 >> current  Cefepime 4/28 >> current  DVT prophylaxis:  SCDs Start: 04/10/23 2303 apixaban (ELIQUIS) tablet 5 mg   Code Status:   Code Status: Full Code  Mobility Assessment (last 72 hours)     Mobility Assessment     Row Name 04/11/23 03:24:15           Does patient have an order for bedrest or is patient medically unstable No - Continue assessment       What is the highest level of mobility based on the progressive mobility assessment? Level 6 (Walks independently in room and hall) - Balance while walking in room without assist - Complete                Barriers to discharge: none Disposition Plan:  Home Status is: Inpt  Objective: Blood pressure (!) 151/79, pulse 72, temperature 97.8 F (36.6 C), temperature source Oral, resp. rate 18, height 5\' 10"  (1.778 m), weight 104.3 kg, SpO2 98 %.  Examination:  Physical  Exam Constitutional:      General: He is not in acute distress.    Appearance: Normal appearance.  HENT:     Head: Normocephalic and atraumatic.     Mouth/Throat:     Mouth: Mucous membranes are moist.  Eyes:     Extraocular Movements: Extraocular movements intact.  Cardiovascular:     Rate and Rhythm: Normal rate and regular rhythm.  Pulmonary:     Effort: Pulmonary effort is normal. No respiratory distress.     Breath sounds: Normal breath sounds.  No wheezing.  Abdominal:     General: Bowel sounds are normal. There is no distension.     Palpations: Abdomen is soft.     Tenderness: There is no abdominal tenderness.  Musculoskeletal:        General: Swelling present. Normal range of motion.     Cervical back: Normal range of motion and neck supple.  Skin:    General: Skin is warm and dry.     Findings: Erythema (RLE with bright erythema and associated calor, mild TTP, and edema) present.  Neurological:     General: No focal deficit present.     Mental Status: He is alert.  Psychiatric:        Mood and Affect: Mood normal.        Behavior: Behavior normal.      Consultants:    Procedures:    Data Reviewed: Results for orders placed or performed during the hospital encounter of 04/10/23 (from the past 24 hour(s))  Comprehensive metabolic panel     Status: Abnormal   Collection Time: 04/10/23  4:52 PM  Result Value Ref Range   Sodium 138 135 - 145 mmol/L   Potassium 4.1 3.5 - 5.1 mmol/L   Chloride 104 98 - 111 mmol/L   CO2 25 22 - 32 mmol/L   Glucose, Bld 116 (H) 70 - 99 mg/dL   BUN 27 (H) 8 - 23 mg/dL   Creatinine, Ser 1.61 0.61 - 1.24 mg/dL   Calcium 9.3 8.9 - 09.6 mg/dL   Total Protein 7.1 6.5 - 8.1 g/dL   Albumin 3.4 (L) 3.5 - 5.0 g/dL   AST 29 15 - 41 U/L   ALT 26 0 - 44 U/L   Alkaline Phosphatase 50 38 - 126 U/L   Total Bilirubin 1.2 0.3 - 1.2 mg/dL   GFR, Estimated >04 >54 mL/min   Anion gap 9 5 - 15  CBC with Differential     Status: Abnormal   Collection Time: 04/10/23  4:52 PM  Result Value Ref Range   WBC 8.9 4.0 - 10.5 K/uL   RBC 5.56 4.22 - 5.81 MIL/uL   Hemoglobin 14.1 13.0 - 17.0 g/dL   HCT 09.8 11.9 - 14.7 %   MCV 80.8 80.0 - 100.0 fL   MCH 25.4 (L) 26.0 - 34.0 pg   MCHC 31.4 30.0 - 36.0 g/dL   RDW 82.9 (H) 56.2 - 13.0 %   Platelets 214 150 - 400 K/uL   nRBC 0.0 0.0 - 0.2 %   Neutrophils Relative % 80 %   Neutro Abs 7.1 1.7 - 7.7 K/uL   Lymphocytes Relative 9 %   Lymphs Abs 0.8 0.7 -  4.0 K/uL   Monocytes Relative 5 %   Monocytes Absolute 0.4 0.1 - 1.0 K/uL   Eosinophils Relative 4 %   Eosinophils Absolute 0.4 0.0 - 0.5 K/uL   Basophils Relative 1 %   Basophils Absolute 0.1 0.0 -  0.1 K/uL   nRBC 0 0 /100 WBC   Promyelocytes Relative 1 %   Abs Immature Granulocytes 0.10 (H) 0.00 - 0.07 K/uL  Protime-INR     Status: Abnormal   Collection Time: 04/10/23  4:52 PM  Result Value Ref Range   Prothrombin Time 15.8 (H) 11.4 - 15.2 seconds   INR 1.3 (H) 0.8 - 1.2  Brain natriuretic peptide     Status: None   Collection Time: 04/10/23  4:52 PM  Result Value Ref Range   B Natriuretic Peptide 87.7 0.0 - 100.0 pg/mL  Lactic acid, plasma     Status: None   Collection Time: 04/10/23  5:05 PM  Result Value Ref Range   Lactic Acid, Venous 1.8 0.5 - 1.9 mmol/L  Culture, blood (Routine x 2)     Status: None (Preliminary result)   Collection Time: 04/10/23  5:05 PM   Specimen: BLOOD RIGHT HAND  Result Value Ref Range   Specimen Description BLOOD RIGHT HAND    Special Requests      BOTTLES DRAWN AEROBIC AND ANAEROBIC Blood Culture adequate volume   Culture      NO GROWTH < 24 HOURS Performed at [facility], [address] [place], [place] [address]    Report Status PENDING   Culture, blood (Routine x 2)     Status: None (Preliminary result)   Collection Time: 04/10/23  6:25 PM   Specimen: BLOOD LEFT FOREARM  Result Value Ref Range   Specimen Description BLOOD LEFT FOREARM    Special Requests      BOTTLES DRAWN AEROBIC AND ANAEROBIC Blood Culture results may not be optimal due to an inadequate volume of blood received in culture bottles   Culture      NO GROWTH < 12 HOURS Performed at [facility], [address], [place], [place] [address]    Report Status PENDING   Lactic acid, plasma     Status: None   Collection Time: 04/10/23  6:52 PM  Result Value Ref Range   Lactic Acid, Venous 1.4 0.5 - 1.9 mmol/L  Magnesium     Status: None   Collection Time:  04/11/23  5:00 AM  Result Value Ref Range   Magnesium 2.1 1.7 - 2.4 mg/dL  CBC with Differential/Platelet     Status: Abnormal   Collection Time: 04/11/23  5:00 AM  Result Value Ref Range   WBC 8.7 4.0 - 10.5 K/uL   RBC 5.06 4.22 - 5.81 MIL/uL   Hemoglobin 12.7 (L) 13.0 - 17.0 g/dL   HCT 09.8 11.9 - 14.7 %   MCV 83.8 80.0 - 100.0 fL   MCH 25.1 (L) 26.0 - 34.0 pg   MCHC 30.0 30.0 - 36.0 g/dL   RDW 82.9 (H) 56.2 - 13.0 %   Platelets 200 150 - 400 K/uL   nRBC 0.0 0.0 - 0.2 %   Neutrophils Relative % 78 %   Neutro Abs 6.8 1.7 - 7.7 K/uL   Lymphocytes Relative 7 %   Lymphs Abs 0.6 (L) 0.7 - 4.0 K/uL   Monocytes Relative 9 %   Monocytes Absolute 0.8 0.1 - 1.0 K/uL   Eosinophils Relative 4 %   Eosinophils Absolute 0.3 0.0 - 0.5 K/uL   Basophils Relative 1 %   Basophils Absolute 0.1 0.0 - 0.1 K/uL   WBC Morphology MORPHOLOGY UNREMARKABLE    RBC Morphology See Note    Smear Review MORPHOLOGY UNREMARKABLE    Immature Granulocytes  1 %   Abs Immature Granulocytes 0.10 (H) 0.00 - 0.07 K/uL   Ovalocytes PRESENT     I have reviewed pertinent nursing notes, vitals, labs, and images as necessary. I have ordered labwork to follow up on as indicated.  I have reviewed the last notes from staff over past 24 hours. I have discussed patient's care plan and test results with nursing staff, CM/SW, and other staff as appropriate.  Time spent: Greater than 50% of the 55 minute visit was spent in counseling/coordination of care for the patient as laid out in the A&P.   LOS: 0 days   Lewie Chamber, MD [facility] 04/11/2023, 1:08 PM

## 2023-04-11 NOTE — Hospital Course (Signed)
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   Patient is from [place], [place].  Patient had been admitted to the hospital from March 21, 2023 through March 29, 2023.  He was treated for Klebsiella and Serratia pneumonia.  He was placed on IV meropenem at discharge.  He completed 10 additional days of IV meropenem with PICC line.  On the day he stopped his meropenem, he developed cellulitis of his right leg.  He was seen in clinic the next day and started on p.o. Keflex.  Patient had already bought tickets to come visit his daughter who lives here in [place] . He has noted since arriving to [facility] his cellulitis had continued to worsen.  He has been having low-grade fevers at home.  Increasing swelling and erythema.  Patient brought to the ER by his daughter.   He also had recent right lower extremity duplex on 04/07/2023 in [place] which was negative for DVT. He was started on vancomycin/cefepime on admission.

## 2023-04-11 NOTE — ED Notes (Signed)
ED TO INPATIENT HANDOFF REPORT  ED Nurse Name and Phone #: Josh  S Name/Age/Gender Brent Arnold 82 y.o. male Room/Bed: 006C/006C  Code Status   Code Status: Full Code  Home/SNF/Other Home Patient oriented to: self, place, time, and situation Is this baseline? Yes   Triage Complete: Triage complete  Chief Complaint Cellulitis of right leg [L03.115]  Triage Note Pt being treated with Keflex for possible cellulitis, currently on day four of treatment with minimal improvement. Pt has edema and redness to the right lower extremity. Right pedal pulse intact. Marking for boarder of infection have been surpassed. Pt reports there was drainage to site that has since stopped. Pt alert and oriented speaking in full sentences.   Recent back surgery, COVID PNA, and multiple ABX use within the past month.   Allergies Allergies  Allergen Reactions   Dilantin [Phenytoin] Hives   Gabapentin Rash    Level of Care/Admitting Diagnosis ED Disposition     ED Disposition  Admit   Condition  --   Comment  Hospital Area: [facility] [[id number]]  Level of Care: Med-Surg [16]  May place patient in observation at [facility] or [facility] if equivalent level of care is available:: No  Covid Evaluation: Asymptomatic - no recent exposure (last 10 days) testing not required  Diagnosis: Cellulitis of right leg [161096]  Admitting Physician: Imogene Burn, ERIC [[id number]]  Attending Physician: Imogene Burn, ERIC [[id number]]          B Medical/Surgery History Past Medical History:  Diagnosis Date   Antiphospholipid antibody syndrome (HCC)    Arthritis    Asthma    Crohn disease (HCC)    DVT (deep venous thrombosis) (HCC)    Hypertension    Past Surgical History:  Procedure Laterality Date   BACK SURGERY     CARDIAC SURGERY     CORONARY ARTERY BYPASS GRAFT     2001   HERNIA REPAIR       A IV Location/Drains/Wounds Patient Lines/Drains/Airways Status     Active Line/Drains/Airways      Name Placement date Placement time Site Days   Peripheral IV 04/10/23 20 G Anterior;Left Forearm 04/10/23  1930  Forearm  1   Peripheral IV 04/10/23 20 G Anterior;Right Forearm 04/10/23  2155  Forearm  1            Intake/Output Last 24 hours  Intake/Output Summary (Last 24 hours) at 04/11/2023 0724 Last data filed at 04/11/2023 0029 Gross per 24 hour  Intake 500 ml  Output --  Net 500 ml    Labs/Imaging Results for orders placed or performed during the hospital encounter of 04/10/23 (from the past 48 hour(s))  Comprehensive metabolic panel     Status: Abnormal   Collection Time: 04/10/23  4:52 PM  Result Value Ref Range   Sodium 138 135 - 145 mmol/L   Potassium 4.1 3.5 - 5.1 mmol/L   Chloride 104 98 - 111 mmol/L   CO2 25 22 - 32 mmol/L   Glucose, Bld 116 (H) 70 - 99 mg/dL    Comment: Glucose reference range applies only to samples taken after fasting for at least 8 hours.   BUN 27 (H) 8 - 23 mg/dL   Creatinine, Ser 0.45 0.61 - 1.24 mg/dL   Calcium 9.3 8.9 - 40.9 mg/dL   Total Protein 7.1 6.5 - 8.1 g/dL   Albumin 3.4 (L) 3.5 - 5.0 g/dL   AST 29 15 - 41 U/L   ALT 26  0 - 44 U/L   Alkaline Phosphatase 50 38 - 126 U/L   Total Bilirubin 1.2 0.3 - 1.2 mg/dL   GFR, Estimated >16 >10 mL/min    Comment: (NOTE) Calculated using the CKD-EPI Creatinine Equation (2021)    Anion gap 9 5 - 15    Comment: Performed at [facility], [address] [place], [place] [address]  CBC with Differential     Status: Abnormal   Collection Time: 04/10/23  4:52 PM  Result Value Ref Range   WBC 8.9 4.0 - 10.5 K/uL   RBC 5.56 4.22 - 5.81 MIL/uL   Hemoglobin 14.1 13.0 - 17.0 g/dL   HCT 40.9 81.1 - 91.4 %   MCV 80.8 80.0 - 100.0 fL   MCH 25.4 (L) 26.0 - 34.0 pg   MCHC 31.4 30.0 - 36.0 g/dL   RDW 78.2 (H) 95.6 - 21.3 %   Platelets 214 150 - 400 K/uL   nRBC 0.0 0.0 - 0.2 %   Neutrophils Relative % 80 %   Neutro Abs 7.1 1.7 - 7.7 K/uL   Lymphocytes Relative 9 %   Lymphs Abs 0.8  0.7 - 4.0 K/uL   Monocytes Relative 5 %   Monocytes Absolute 0.4 0.1 - 1.0 K/uL   Eosinophils Relative 4 %   Eosinophils Absolute 0.4 0.0 - 0.5 K/uL   Basophils Relative 1 %   Basophils Absolute 0.1 0.0 - 0.1 K/uL   nRBC 0 0 /100 WBC   Promyelocytes Relative 1 %   Abs Immature Granulocytes 0.10 (H) 0.00 - 0.07 K/uL    Comment: Performed at [facility], [address] [place], [place] [address]  Protime-INR     Status: Abnormal   Collection Time: 04/10/23  4:52 PM  Result Value Ref Range   Prothrombin Time 15.8 (H) 11.4 - 15.2 seconds   INR 1.3 (H) 0.8 - 1.2    Comment: (NOTE) INR goal varies based on device and disease states. Performed at [facility], [address], [place], [place] [address]   Brain natriuretic peptide     Status: None   Collection Time: 04/10/23  4:52 PM  Result Value Ref Range   B Natriuretic Peptide 87.7 0.0 - 100.0 pg/mL    Comment: Performed at [facility], [address], [place], [place] [address]  Lactic acid, plasma     Status: None   Collection Time: 04/10/23  5:05 PM  Result Value Ref Range   Lactic Acid, Venous 1.8 0.5 - 1.9 mmol/L    Comment: Performed at [facility], [address], [place], [place] [address]  Lactic acid, plasma     Status: None   Collection Time: 04/10/23  6:52 PM  Result Value Ref Range   Lactic Acid, Venous 1.4 0.5 - 1.9 mmol/L    Comment: Performed at [facility], [address] [place], [place] [address]   VAS [place] LOWER EXTREMITY VENOUS (DVT) (ONLY [facility] & [facility])  Result Date: 04/10/2023  Lower Venous DVT Study Patient Name:  Brent Arnold  Date of Exam:   04/10/2023 Medical Rec #: [id number]       Accession #:    [id number] Date of Birth: October 23, 1941       Patient Gender: M Patient Age:   31 years Exam Location:  [facility] Procedure:      VAS [place] LOWER EXTREMITY VENOUS (DVT) Referring Phys: BRITNI HENDERLY --------------------------------------------------------------------------------  Indications: Worsening pain, edema, erythema. Recently diagnosed with cellulitis at outside facility.  Comparison Study: No prior study on file at [facility]. However, most recent                   right LEV done at [facility] 04/07/2023 was negative for                   DVT. Performing Technologist: Sherren Kerns RVS  Examination Guidelines: A complete evaluation includes B-mode imaging, spectral Doppler, color Doppler, and power Doppler as needed of all accessible portions of each vessel. Bilateral testing is considered an integral part of a complete examination. Limited examinations for reoccurring indications may be performed as noted. The reflux portion of the exam is performed with the patient in reverse Trendelenburg.  +---------+---------------+---------+-----------+----------+--------------+ RIGHT    CompressibilityPhasicitySpontaneityPropertiesThrombus Aging +---------+---------------+---------+-----------+----------+--------------+ CFV      Full           Yes      Yes                                 +---------+---------------+---------+-----------+----------+--------------+ SFJ      Full                                                        +---------+---------------+---------+-----------+----------+--------------+ FV Prox  Full                                                        +---------+---------------+---------+-----------+----------+--------------+ FV Mid   Full           Yes      Yes                                 +---------+---------------+---------+-----------+----------+--------------+ FV DistalFull                                                        +---------+---------------+---------+-----------+----------+--------------+ PFV      Full                                                        +---------+---------------+---------+-----------+----------+--------------+ POP      Full           Yes      Yes                                  +---------+---------------+---------+-----------+----------+--------------+ PTV      Full                                                        +---------+---------------+---------+-----------+----------+--------------+  PERO     Full                                                        +---------+---------------+---------+-----------+----------+--------------+   +----+---------------+---------+-----------+----------+--------------+ LEFTCompressibilityPhasicitySpontaneityPropertiesThrombus Aging +----+---------------+---------+-----------+----------+--------------+ CFV Full           Yes      Yes                                 +----+---------------+---------+-----------+----------+--------------+     Summary: RIGHT: - There is no evidence of deep vein thrombosis in the lower extremity.  LEFT: - No evidence of common femoral vein obstruction.  *See table(s) above for measurements and observations.    Preliminary    DG Tibia/Fibula Right  Result Date: 04/10/2023 CLINICAL DATA:  Possible infection EXAM: RIGHT TIBIA AND FIBULA - 2 VIEW COMPARISON:  None. FINDINGS: There is diffuse soft tissue swelling and edema throughout the lower extremity. There is no evidence for foreign body or obvious gas collection. Peripheral vascular calcifications are present. There is no acute fracture or dislocation. No cortical erosions are identified. Sequelae of old trauma noted at the level of the medial malleolus. IMPRESSION: 1. Diffuse soft tissue swelling and edema throughout the lower extremity. 2. No acute osseous abnormality. Electronically Signed   By: Darliss Cheney M.D.   On: 04/10/2023 18:10    Pending Labs Unresulted Labs (From admission, onward)     Start     Ordered   04/11/23 0500  Magnesium  Tomorrow morning,   R        04/10/23 2303   04/11/23 0500  CBC with Differential/Platelet  Tomorrow morning,   R        04/10/23 2303   04/10/23 2303  Antistreptolysin O titer   Once,   R        04/10/23 2303   04/10/23 2303  MRSA Next Gen by PCR, Nasal  Once,   R        04/10/23 2303   04/10/23 1652  Culture, blood (Routine x 2)  BLOOD CULTURE X 2,   R (with STAT occurrences)      04/10/23 1651   Signed and Held  Comprehensive metabolic panel  Tomorrow morning,   R        Signed and Held            Vitals/Pain Today's Vitals   04/11/23 0400 04/11/23 0500 04/11/23 0600 04/11/23 0609  BP: 112/66 (!) 123/58 (!) 116/56   Pulse: 69 68 66   Resp: 19 (!) 22 16   Temp:    97.7 F (36.5 C)  TempSrc:    Oral  SpO2: 95% 97% 94%   Weight:      Height:      PainSc:        Isolation Precautions No active isolations  Medications Medications  acetaminophen (TYLENOL) tablet 650 mg (has no administration in time range)    Or  acetaminophen (TYLENOL) suppository 650 mg (has no administration in time range)  oxyCODONE (Oxy IR/ROXICODONE) immediate release tablet 5 mg (has no administration in time range)  ondansetron (ZOFRAN) tablet 4 mg (has no administration in time range)    Or  ondansetron (ZOFRAN) injection 4 mg (has no administration in  time range)  apixaban (ELIQUIS) tablet 5 mg (5 mg Oral Given 04/10/23 2330)  atorvastatin (LIPITOR) tablet 40 mg (has no administration in time range)  budesonide (ENTOCORT EC) 24 hr capsule 3 mg (3 mg Oral Given 04/11/23 0027)  pantoprazole (PROTONIX) EC tablet 40 mg (has no administration in time range)  finasteride (PROSCAR) tablet 5 mg (has no administration in time range)  tamsulosin (FLOMAX) capsule 0.4 mg (has no administration in time range)  furosemide (LASIX) tablet 40 mg (has no administration in time range)  metoprolol succinate (TOPROL-XL) 24 hr tablet 25 mg (has no administration in time range)  montelukast (SINGULAIR) tablet 10 mg (10 mg Oral Given 04/10/23 2330)  spironolactone (ALDACTONE) tablet 25 mg (has no administration in time range)  ceFEPIme (MAXIPIME) 2 g in sodium chloride 0.9 % 100 mL IVPB (0 g  Intravenous Stopped 04/11/23 0721)  vancomycin (VANCOREADY) IVPB 2000 mg/400 mL (0 mg Intravenous Stopped 04/11/23 0029)    Followed by  vancomycin (VANCOREADY) IVPB 1750 mg/350 mL (has no administration in time range)    Mobility walks     Focused Assessments    R Recommendations: See Admitting Provider Note  Report given to:   Additional Notes:

## 2023-04-12 DIAGNOSIS — L03115 Cellulitis of right lower limb: Secondary | ICD-10-CM | POA: Diagnosis not present

## 2023-04-12 LAB — CULTURE, BLOOD (ROUTINE X 2): Culture: NO GROWTH

## 2023-04-12 LAB — ANTISTREPTOLYSIN O TITER: ASO: 134 IU/mL (ref 0.0–200.0)

## 2023-04-12 NOTE — Plan of Care (Signed)
  Problem: Education: Goal: Knowledge of General Education information will improve Description: Including pain rating scale, medication(s)/side effects and non-pharmacologic comfort measures Outcome: Progressing   Problem: Health Behavior/Discharge Planning: Goal: Ability to manage health-related needs will improve Outcome: Progressing   Problem: Clinical Measurements: Goal: Ability to maintain clinical measurements within normal limits will improve Outcome: Progressing Goal: Will remain free from infection Outcome: Progressing Goal: Respiratory complications will improve Outcome: Progressing Goal: Cardiovascular complication will be avoided Outcome: Progressing   Problem: Activity: Goal: Risk for activity intolerance will decrease Outcome: Progressing   Problem: Nutrition: Goal: Adequate nutrition will be maintained Outcome: Progressing   Problem: Coping: Goal: Level of anxiety will decrease Outcome: Progressing   Problem: Elimination: Goal: Will not experience complications related to bowel motility Outcome: Progressing   Problem: Pain Managment: Goal: General experience of comfort will improve Outcome: Progressing   Problem: Safety: Goal: Ability to remain free from injury will improve Outcome: Progressing   

## 2023-04-12 NOTE — Progress Notes (Signed)
Progress Note    Brent Arnold   ZOX:[id number]  DOB: [birth year]  DOA: 04/10/2023     1 PCP: Pcp, No  Initial CC: RLE cellulitis   Hospital Course: Brent Arnold is an 82 year old white male history of hypertension, antiphospholipid antibody syndrome on chronic Eliquis, history of DVT, Crohn's disease, BPH, coronary disease status post CABG who presented to the ER with worsening cellulitis of his right leg.   Patient is from [place], [place].  Patient had been admitted to the hospital from March 21, 2023 through March 29, 2023.  He was treated for Klebsiella and Serratia pneumonia.  He was placed on IV meropenem at discharge.  He completed 10 additional days of IV meropenem with PICC line.  On the day he stopped his meropenem, he developed cellulitis of his right leg.  He was seen in clinic the next day and started on p.o. Keflex.  Patient had already bought tickets to come visit his daughter who lives here in [place] . He has noted since arriving to [facility] his cellulitis had continued to worsen.  He has been having low-grade fevers at home.  Increasing swelling and erythema.  Patient brought to the ER by his daughter.   He also had recent right lower extremity duplex on 04/07/2023 in [place] which was negative for DVT. He was started on vancomycin/cefepime on admission.  Interval History:  No events overnight.  States his leg seems improved especially in terms of the erythema.  Also less tender today.  Still rather edematous. Spoke with his son on the phone while in the room and update given with questions answered.  Assessment and Plan: * Cellulitis of right leg - Patient was on Keflex for approximately 4 days outpatient with no improvement.  Reasonable to continue IV antibiotics and monitor for some improvement before transitioning back to oral regimen.  Overnight there has been some improvement in the erythema compared to where a skin marker was placed yesterday - ASO titer normal  range - Continue vancomycin/cefepime -Erythema is lighter in tone today.  Has spread outside skin marker borders but appears to be overall improving and lightening in nature -Edema persists.  He has history of venoplasty in right lower extremity but states he has no residual edema typically.  He received large amount of fluids for recent hospitalization from pneumonia -Other consideration of erythema was venous stasis dermatitis versus PAD.  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 BMI 33  Hyperlipidemia, unspecified Stable.  Benign prostatic hyperplasia with lower urinary tract symptoms Continue flomax 0.4 mg daily.  Crohn disease (HCC) Stable.  Hypertension Stable. Continue Toprol-XL 25 mg daily.  Antiphospholipid antibody syndrome (HCC) Chronic. Continue Eliquis.   Old records reviewed in assessment of this patient  Antimicrobials: Vanc 4/28 >> current  Cefepime 4/28 >> current  DVT prophylaxis:  SCDs Start: 04/10/23 2303 apixaban (ELIQUIS) tablet 5 mg   Code Status:   Code Status: Full Code  Mobility Assessment (last 72 hours)     Mobility Assessment     Row Name 04/11/23 2235 04/11/23 03:24:15         Does patient have an order for bedrest or is patient medically unstable No - Continue assessment No - Continue assessment      What is the highest level of mobility based on the progressive mobility assessment? Level 6 (Walks independently in room and hall) - Balance while walking in room without assist - Complete Level  6 (Walks independently in room and hall) - Balance while walking in room without assist - Complete               Barriers to discharge: none Disposition Plan:  Home Status is: Inpt  Objective: Blood pressure 132/75, pulse 70, temperature 98.2 F (36.8 C), temperature source Oral, resp. rate 18, height 5\' 10"  (1.778 m), weight 104.3 kg, SpO2 95 %.  Examination:  Physical  Exam Constitutional:      General: He is not in acute distress.    Appearance: Normal appearance.  HENT:     Head: Normocephalic and atraumatic.     Mouth/Throat:     Mouth: Mucous membranes are moist.  Eyes:     Extraocular Movements: Extraocular movements intact.  Cardiovascular:     Rate and Rhythm: Normal rate and regular rhythm.  Pulmonary:     Effort: Pulmonary effort is normal. No respiratory distress.     Breath sounds: Normal breath sounds. No wheezing.  Abdominal:     General: Bowel sounds are normal. There is no distension.     Palpations: Abdomen is soft.     Tenderness: There is no abdominal tenderness.  Musculoskeletal:        General: Swelling present. Normal range of motion.     Cervical back: Normal range of motion and neck supple.  Skin:    General: Skin is warm and dry.     Findings: Erythema (RLE with bright erythema and associated calor, mild TTP, and edema) present.  Neurological:     General: No focal deficit present.     Mental Status: He is alert.  Psychiatric:        Mood and Affect: Mood normal.        Behavior: Behavior normal.    Pic taken 04/12/23:     Consultants:    Procedures:    Data Reviewed: No results found for this or any previous visit (from the past 24 hour(s)).   I have reviewed pertinent nursing notes, vitals, labs, and images as necessary. I have ordered labwork to follow up on as indicated.  I have reviewed the last notes from staff over past 24 hours. I have discussed patient's care plan and test results with nursing staff, CM/SW, and other staff as appropriate.  Time spent: Greater than 50% of the 55 minute visit was spent in counseling/coordination of care for the patient as laid out in the A&P.   LOS: 1 day   Lewie Chamber, MD [facility] 04/12/2023, 12:35 PM

## 2023-04-12 NOTE — TOC Progression Note (Signed)
Transition of Care [facility]) - Progression Note    Patient Details  Name: Brent Arnold MRN: [id number] Date of Birth: 1941-06-03  Transition of Care [facility]) CM/SW Contact  Janae Bridgeman, RN Phone Number: 04/12/2023, 3:07 PM  Clinical Narrative:     Transition of Care [facility]) Screening Note   Patient Details  Name: Brent Arnold Date of Birth: 1941/05/29   Transition of Care [facility]) CM/SW Contact:    Janae Bridgeman, RN Phone Number: 04/12/2023, 3:07 PM    Transition of Care [facility]) has reviewed patient and no TOC needs have been identified at this time. We will continue to monitor patient advancement through interdisciplinary progression rounds. If new patient transition needs arise, please place a TOC consult.          Expected Discharge Plan and Services                                               Social Determinants of Health (SDOH) Interventions SDOH Screenings   Food Insecurity: No Food Insecurity (04/11/2023)  Housing: Low Risk  (04/11/2023)  Transportation Needs: No Transportation Needs (04/11/2023)  Utilities: Not At Risk (04/11/2023)  Tobacco Use: Low Risk  (04/11/2023)    Readmission Risk Interventions     No data to display

## 2023-04-13 ENCOUNTER — Inpatient Hospital Stay (HOSPITAL_COMMUNITY): Payer: Medicare PPO

## 2023-04-13 DIAGNOSIS — N401 Enlarged prostate with lower urinary tract symptoms: Secondary | ICD-10-CM | POA: Diagnosis not present

## 2023-04-13 DIAGNOSIS — D6861 Antiphospholipid syndrome: Secondary | ICD-10-CM | POA: Diagnosis not present

## 2023-04-13 DIAGNOSIS — I7090 Unspecified atherosclerosis: Secondary | ICD-10-CM | POA: Diagnosis not present

## 2023-04-13 DIAGNOSIS — K50919 Crohn's disease, unspecified, with unspecified complications: Secondary | ICD-10-CM | POA: Diagnosis not present

## 2023-04-13 DIAGNOSIS — L03115 Cellulitis of right lower limb: Secondary | ICD-10-CM | POA: Diagnosis not present

## 2023-04-13 LAB — CBC WITH DIFFERENTIAL/PLATELET
Abs Immature Granulocytes: 0.16 10*3/uL — ABNORMAL HIGH (ref 0.00–0.07)
Basophils Absolute: 0.1 10*3/uL (ref 0.0–0.1)
Basophils Relative: 1 %
Eosinophils Absolute: 0.5 10*3/uL (ref 0.0–0.5)
Eosinophils Relative: 4 %
HCT: 42.5 % (ref 39.0–52.0)
Hemoglobin: 13.4 g/dL (ref 13.0–17.0)
Immature Granulocytes: 1 %
Lymphocytes Relative: 5 %
Lymphs Abs: 0.6 10*3/uL — ABNORMAL LOW (ref 0.7–4.0)
MCH: 25.3 pg — ABNORMAL LOW (ref 26.0–34.0)
MCHC: 31.5 g/dL (ref 30.0–36.0)
MCV: 80.2 fL (ref 80.0–100.0)
Monocytes Absolute: 1.3 10*3/uL — ABNORMAL HIGH (ref 0.1–1.0)
Monocytes Relative: 10 %
Neutro Abs: 9.9 10*3/uL — ABNORMAL HIGH (ref 1.7–7.7)
Neutrophils Relative %: 79 %
Platelets: 238 10*3/uL (ref 150–400)
RBC: 5.3 MIL/uL (ref 4.22–5.81)
RDW: 24.6 % — ABNORMAL HIGH (ref 11.5–15.5)
WBC: 12.5 10*3/uL — ABNORMAL HIGH (ref 4.0–10.5)
nRBC: 0 % (ref 0.0–0.2)

## 2023-04-13 LAB — BASIC METABOLIC PANEL
Anion gap: 11 (ref 5–15)
BUN: 19 mg/dL (ref 8–23)
CO2: 21 mmol/L — ABNORMAL LOW (ref 22–32)
Calcium: 8.4 mg/dL — ABNORMAL LOW (ref 8.9–10.3)
Chloride: 103 mmol/L (ref 98–111)
Creatinine, Ser: 0.82 mg/dL (ref 0.61–1.24)
GFR, Estimated: >60 mL/min (ref >60)
Glucose, Bld: 132 mg/dL — ABNORMAL HIGH (ref 70–99)
Potassium: 3.9 mmol/L (ref 3.5–5.1)
Sodium: 135 mmol/L (ref 135–145)

## 2023-04-13 LAB — VAS US ABI WITH/WO TBI

## 2023-04-13 LAB — MAGNESIUM: Magnesium: 1.8 mg/dL (ref 1.7–2.4)

## 2023-04-13 LAB — CULTURE, BLOOD (ROUTINE X 2)

## 2023-04-13 MED ORDER — CEFAZOLIN SODIUM-DEXTROSE 2-4 GM/100ML-% IV SOLN
2.0000 g | Freq: Three times a day (TID) | INTRAVENOUS | Status: DC
  Administered 2023-04-13 – 2023-04-15 (×6): 2 g via INTRAVENOUS
  Filled 2023-04-13 (×6): qty 100

## 2023-04-13 NOTE — TOC Initial Note (Signed)
Transition of Care [facility]) - Initial/Assessment Note    Patient Details  Name: Brent Arnold MRN: [id number] Date of Birth: 08-25-1941  Transition of Care [facility]) CM/SW Contact:    Janae Bridgeman, RN Phone Number: 04/13/2023, 3:48 PM  Clinical Narrative:                 CM met with the patient and wife at the bedside to discuss TOC needs.  The patient lives at home in [place] but traveled to [place] with his wife to visit the patient's daughter, Johny Blamer.  The patient will be visiting at the daughter's home in [place] at [address] [place], [place] [address].  The patient was recently discharged from home health services in [place] for IV antibiotics and PICC line that were discontinued prior to patient's travel to [place].  The patient has history of DVT in right leg and temporary filter in the past - 15 years ago.  The patient now has Right lower leg cellulitis and is being followed by ID MD.  The patient was provided with Medicare choice regarding home health and he did not have a preference.  Patient was set up with [facility] for PT and orders placed for MD to co-sign.  [facility] follow up provided in the discharge instructions.  CM will continue to follow the patient for TOC needs.  Patient plans to return home to daughter's house by car when he is medically stable for discharge.  Expected Discharge Plan: Home w Home Health Services Barriers to Discharge: Continued Medical Work up   Patient Goals and CMS Choice Patient states their goals for this hospitalization and ongoing recovery are:: To return home CMS Medicare.gov Compare Post Acute Care list provided to:: Patient (wife at bedside) Choice offered to / list presented to : Patient [place] ownership interest in [facility].provided to:: Patient    Expected Discharge Plan and Services   Discharge Planning Services: CM Consult Post Acute Care Choice: Home Health Living arrangements for the past 2  months: Single Family Home (Patient is visiting daughter and staying at her home in [place] - address listed initial note)                           HH Arranged: PT HH Agency: [facility] Date [facility] Agency Contacted: 04/13/23 Time HH Agency Contacted: [phone] Representative spoke with at [facility] Agency: Kandee Keen, RNCM with [facility] accepted for [facility] PT  Prior Living Arrangements/Services Living arrangements for the past 2 months: Single Family Home (Patient is visiting daughter and staying at her home in [place] - address listed initial note) Lives with:: Spouse Patient language and need for interpreter reviewed:: Yes Do you feel safe going back to the place where you live?: Yes      Need for Family Participation in Patient Care: Yes (Comment) Care giver support system in place?: Yes (comment) Current home services: DME (Rolator, cane at home) Criminal Activity/Legal Involvement Pertinent to Current Situation/Hospitalization: No - Comment as needed  Activities of Daily Living Home Assistive Devices/Equipment: Cane (specify quad or straight), Walker (specify type) ADL Screening (condition at time of admission) Patient's cognitive ability adequate to safely complete daily activities?: No Is the patient deaf or have difficulty hearing?: No Does the patient have difficulty seeing, even when wearing glasses/contacts?: Yes Does the patient have difficulty concentrating, remembering, or making decisions?: No Patient able to express need for assistance with ADLs?: No Does  the patient have difficulty dressing or bathing?: No Independently performs ADLs?: Yes (appropriate for developmental age) Does the patient have difficulty walking or climbing stairs?: Yes Weakness of Legs: Right Weakness of Arms/Hands: None  Permission Sought/Granted Permission sought to share information with : Case Manager, Family Supports, Photographer granted to share info  w AGENCY: Home Health agency  Permission granted to share info w Relationship: wife at bedside     Emotional Assessment Appearance:: Appears stated age Attitude/Demeanor/Rapport: Gracious Affect (typically observed): Accepting Orientation: : Oriented to Self, Oriented to Place, Oriented to  Time Alcohol / Substance Use: Not Applicable Psych Involvement: No (comment)  Admission diagnosis:  Cellulitis of right leg [L03.115] Cellulitis of right lower extremity [L03.115] Patient Active Problem List   Diagnosis Date Noted   Cellulitis of right leg 04/10/2023   Antiphospholipid antibody syndrome (HCC) 04/10/2023   Hypertension 04/10/2023   Crohn disease (HCC) 04/10/2023   Benign prostatic hyperplasia with lower urinary tract symptoms 03/31/2023   Hyperlipidemia, unspecified 03/31/2023   Morbid (severe) obesity due to excess calories (HCC) 03/30/2023   PCP:  Pcp, No Pharmacy:   [id number] - [place], [place] - [address] AT [facility] [address] [place] [address] Phone: [phone] Fax: [phone]  [facility] #[id number] [place], [place] - [address] AT [facility] [address] [facility] [address] Phone: [phone] Fax: [phone]     Social Determinants of Health (SDOH) Social History: SDOH Screenings   Food Insecurity: No Food Insecurity (04/11/2023)  Housing: Low Risk  (04/11/2023)  Transportation Needs: No Transportation Needs (04/11/2023)  Utilities: Not At Risk (04/11/2023)  Tobacco Use: Low Risk  (04/11/2023)   SDOH Interventions:     Readmission Risk Interventions    04/13/2023    3:48 PM  Readmission Risk Prevention Plan  Post Dischage Appt Complete  Medication Screening Complete  Transportation Screening Complete

## 2023-04-13 NOTE — Progress Notes (Signed)
[facility]                                                                              Brent Arnold, is a 82 y.o. male, DOB - 1941/04/07, WNU:[id number] Admit date - 04/10/2023    Outpatient Primary MD for the patient is Pcp, No  LOS - 2  days  Chief Complaint  Patient presents with   Cellulitis       Brief summary  Patient is a 82 year old male with hypertension, antiphospholipid antibody syndrome on chronic Eliquis, history of DVT, Crohn's disease, BPH, coronary disease status post CABG who presented to the ER with worsening cellulitis of his right leg.   Patient is from [place], [place].  He was admitted to the hospital from 03/21/23-03/29/23.  He was treated for Klebsiella and Serratia pneumonia.  He was placed on IV meropenem at discharge.  He completed 10 additional days of IV meropenem with PICC line.  On the day he stopped his meropenem, he developed cellulitis of his right leg.  He was seen in clinic the next day and started on p.o. Keflex.  Patient had already bought tickets to come visit his daughter who lives here in [place] . He has noted since arriving to [facility] his cellulitis had continued to worsen.  He has been having low-grade fevers at home.  Increasing swelling and erythema.  Patient brought to the ER by his daughter.   Patient was placed on IV vancomycin and cefepime, admitted for further workup..    Assessment & Plan    Principal Problem:   Cellulitis of right leg with outpatient failure to treatment -Patient was on Keflex for 4 days outpatient with no improvement and was on IV meropenem prior to transitioning to Keflex. -Venous Dopplers negative for DVT, ABIs showed right and left toe brachial index normal. -ID consulted, appears Streptococcus with bright red erythema, edema and warmth, not typical of MRSA or staph. -Recommended IV cefazolin while inpatient and 7 days of cefadroxil 1000 mg twice daily at discharge. -Elevate  leg -Discussed with patient's son, patient had a recent echo at [place] during the previous admission which had shown EF of 58% with moderate diastolic dysfunction.  He had developed a significant edema during the previous admission due to aggressive IV fluid hydration and volume overload.  --Still has significant edema and erythema.  Patient has been refusing Lasix for the last 2 days due to concern for frequent urination.  Active Problems:   Antiphospholipid antibody syndrome (HCC) -Continue eliquis, chronic    Hypertension -BP stable, continue Toprol-XL    Crohn disease (HCC) -Stable, no acute issues    Benign prostatic hyperplasia with lower urinary tract symptoms -Continue Flomax    Hyperlipidemia, unspecified Stable   Morbid obesity  Estimated body mass index is 33 kg/m as calculated from the following:   Height as of this encounter: 5\' 10"  (1.778 m).   Weight as of this encounter: 104.3 kg.  Code Status: Full code DVT Prophylaxis:  SCDs Start: 04/10/23 2303 apixaban (ELIQUIS) tablet 5 mg   Level of Care: Level of care: Med-Surg Family Communication: Updated patient's son on the  phone (Dr. Truddie Crumble, phone #[phone]) Disposition Plan:      Remains inpatient appropriate: Hopefully in the next 24 to 48 hours if cellulitis is improving   Procedures:  Venous Dopplers 4/28 negative for DVT  Consultants:   Infectious disease  Antimicrobials:   Anti-infectives (From admission, onward)    Start     Dose/Rate Route Frequency Ordered Stop   04/13/23 1400  ceFAZolin (ANCEF) IVPB 2g/100 mL premix        2 g 200 mL/hr over 30 Minutes Intravenous Every 8 hours 04/13/23 1101     04/11/23 2145  vancomycin (VANCOREADY) IVPB 1750 mg/350 mL  Status:  Discontinued       See Hyperspace for full Linked Orders Report.   1,750 mg 175 mL/hr over 120 Minutes Intravenous Every 24 hours 04/10/23 2132 04/13/23 1101   04/10/23 2145  ceFEPIme (MAXIPIME) 2 g in sodium chloride 0.9  % 100 mL IVPB  Status:  Discontinued        2 g 200 mL/hr over 30 Minutes Intravenous Every 8 hours 04/10/23 2132 04/13/23 1101   04/10/23 2145  vancomycin (VANCOREADY) IVPB 2000 mg/400 mL       See Hyperspace for full Linked Orders Report.   2,000 mg 200 mL/hr over 120 Minutes Intravenous  Once 04/10/23 2132 04/11/23 0029   04/10/23 2030  vancomycin (VANCOCIN) IVPB 1000 mg/200 mL premix  Status:  Discontinued        1,000 mg 200 mL/hr over 60 Minutes Intravenous  Once 04/10/23 2026 04/10/23 2130   04/10/23 2030  ceFEPIme (MAXIPIME) 2 g in sodium chloride 0.9 % 100 mL IVPB  Status:  Discontinued        2 g 200 mL/hr over 30 Minutes Intravenous  Once 04/10/23 2026 04/10/23 2130          Medications  apixaban  5 mg Oral BID   atorvastatin  40 mg Oral Daily   budesonide  3 mg Oral BID   finasteride  5 mg Oral Daily   furosemide  40 mg Oral Daily   metoprolol succinate  25 mg Oral Daily   montelukast  10 mg Oral QHS   pantoprazole  40 mg Oral Daily   spironolactone  25 mg Oral Daily   tamsulosin  0.4 mg Oral Daily      Subjective:   Brent Arnold was seen and examined today.  Still has significant edema and redness right lower leg.  Patient denies dizziness, chest pain, shortness of breath, abdominal pain, N/V. No acute events overnight.    Objective:   Vitals:   04/12/23 1556 04/12/23 2001 04/13/23 0434 04/13/23 0810  BP: (!) 119/59 134/71 120/60 123/65  Pulse: 71 73 75 70  Resp: 18 18 17 18   Temp: 98.2 F (36.8 C) 98.2 F (36.8 C) 99.8 F (37.7 C) 98 F (36.7 C)  TempSrc: Oral     SpO2: 95% 97% 93% 96%  Weight:      Height:        Intake/Output Summary (Last 24 hours) at 04/13/2023 1543 Last data filed at 04/13/2023 0434 Gross per 24 hour  Intake --  Output 1025 ml  Net -1025 ml     Wt Readings from Last 3 Encounters:  04/10/23 104.3 kg     Exam General: Alert and oriented x 3, NAD Cardiovascular: S1 S2 auscultated,  RRR Respiratory: Clear to  auscultation bilaterally, no wheezing Gastrointestinal: Soft, nontender, nondistended, + bowel sounds Ext: no pedal edema bilaterally Neuro:  Strength 5/5 upper and lower extremities bilaterally Skin: Erythema, edema right lower leg Psych: Normal affect     Data Reviewed:  I have personally reviewed following labs    CBC Lab Results  Component Value Date   WBC 12.5 (H) 04/13/2023   RBC 5.30 04/13/2023   HGB 13.4 04/13/2023   HCT 42.5 04/13/2023   MCV 80.2 04/13/2023   MCH 25.3 (L) 04/13/2023   PLT 238 04/13/2023   MCHC 31.5 04/13/2023   RDW 24.6 (H) 04/13/2023   LYMPHSABS 0.6 (L) 04/13/2023   MONOABS 1.3 (H) 04/13/2023   EOSABS 0.5 04/13/2023   BASOSABS 0.1 04/13/2023     Last metabolic panel Lab Results  Component Value Date   NA 135 04/13/2023   K 3.9 04/13/2023   CL 103 04/13/2023   CO2 21 (L) 04/13/2023   BUN 19 04/13/2023   CREATININE 0.82 04/13/2023   GLUCOSE 132 (H) 04/13/2023   GFRNONAA >60 04/13/2023   GFRAA  09/02/2010    >60        The eGFR has been calculated using the MDRD equation. This calculation has not been validated in all clinical situations. eGFR's persistently <60 mL/min signify possible Chronic Kidney Disease.   CALCIUM 8.4 (L) 04/13/2023   PROT 7.1 04/10/2023   ALBUMIN 3.4 (L) 04/10/2023   BILITOT 1.2 04/10/2023   ALKPHOS 50 04/10/2023   AST 29 04/10/2023   ALT 26 04/10/2023   ANIONGAP 11 04/13/2023    CBG (last 3)  No results for input(s): "GLUCAP" in the last 72 hours.    Coagulation Profile: Recent Labs  Lab 04/10/23 1652  INR 1.3*     Radiology Studies: I have personally reviewed the imaging studies  VAS [place] ABI WITH/WO TBI  Result Date: 04/13/2023  LOWER EXTREMITY DOPPLER STUDY Patient Name:  Brent Arnold  Date of Exam:   04/13/2023 Medical Rec #: [id number]       Accession #:    [id number] Date of Birth: 25-May-1941       Patient Gender: M Patient Age:   89 years Exam Location:  [facility] Procedure:       VAS [place] ABI WITH/WO TBI Referring Phys: Kaileah Shevchenko --------------------------------------------------------------------------------  Indications: Peripheral artery disease. High Risk Factors: Hypertension, no history of smoking.  Comparison Study: No previous study. Performing Technologist: McKayla Maag RVT, [place]  Examination Guidelines: A complete evaluation includes at minimum, Doppler waveform signals and systolic blood pressure reading at the level of bilateral brachial, anterior tibial, and posterior tibial arteries, when vessel segments are accessible. Bilateral testing is considered an integral part of a complete examination. Photoelectric Plethysmograph (PPG) waveforms and toe systolic pressure readings are included as required and additional duplex testing as needed. Limited examinations for reoccurring indications may be performed as noted.  ABI Findings: +---------+------------------+-----+---------+----------------+ Right    Rt Pressure (mmHg)IndexWaveform Comment          +---------+------------------+-----+---------+----------------+ Brachial 141                    triphasic                 +---------+------------------+-----+---------+----------------+ PTA      255               1.81 triphasicNon-compressible +---------+------------------+-----+---------+----------------+ DP       255               1.81 triphasicNon-compressible +---------+------------------+-----+---------+----------------+ Dayna Barker  0.92 Normal                    +---------+------------------+-----+---------+----------------+ +---------+------------------+-----+---------+----------------+ Left     Lt Pressure (mmHg)IndexWaveform Comment          +---------+------------------+-----+---------+----------------+ Brachial 137                    triphasic                 +---------+------------------+-----+---------+----------------+ PTA      255               1.81  triphasicNon-compressible +---------+------------------+-----+---------+----------------+ DP       255               1.81 triphasicNon-compressible +---------+------------------+-----+---------+----------------+ Great Toe120               0.85 Normal                    +---------+------------------+-----+---------+----------------+ +-------+----------------+-----------+------------+------------+ ABI/TBIToday's ABI     Today's TBIPrevious ABIPrevious TBI +-------+----------------+-----------+------------+------------+ Right  Non-compressible0.92                                +-------+----------------+-----------+------------+------------+ Left   Non-compressible0.85                                +-------+----------------+-----------+------------+------------+   Summary: Right: Resting right ankle-brachial index indicates noncompressible right lower extremity arteries. The right toe-brachial index is normal. Left: Resting left ankle-brachial index indicates noncompressible left lower extremity arteries. The left toe-brachial index is normal. *See table(s) above for measurements and observations.     Preliminary        Thad Ranger M.D. [facility] 04/13/2023, 3:43 PM  Available via Epic secure chat 7am-7pm After 7 pm, please refer to night coverage provider listed on amion.

## 2023-04-13 NOTE — Evaluation (Signed)
Physical Therapy Evaluation Patient Details Name: Brent Arnold MRN: [id number] DOB: 1941/05/23 Today's Date: 04/13/2023  History of Present Illness  Patient is a 82 year old male with history of hypertension, antiphospholipid antibody syndrome, DVT, Crohn's disease, BPH, CABG. Presents with worsening cellulitis of right leg.  Recent hospitalization for pneumonia.   Clinical Impression  Patient is agreeable to PT and eager to be discharged. He is here visiting his daughter and is from [place]. He has been using a rollator for ambulation recently.   The patient required Min A for bed mobility and lifting assistance to stand. He walked around 53ft with rolling walker with supervision without pain and without shortness of breath. Recommend to continue PT to maximize independence and decrease caregiver burden. Anticipate the need for supervision assistance at discharge.      Recommendations for follow up therapy are one component of a multi-disciplinary discharge planning process, led by the attending physician.  Recommendations may be updated based on patient status, additional functional criteria and insurance authorization.  Follow Up Recommendations       Assistance Recommended at Discharge Set up Supervision/Assistance  Patient can return home with the following  A little help with walking and/or transfers;A little help with bathing/dressing/bathroom;Assistance with cooking/housework;Assist for transportation;Help with stairs or ramp for entrance    Equipment Recommendations None recommended by PT  Recommendations for Other Services       Functional Status Assessment Patient has had a recent decline in their functional status and demonstrates the ability to make significant improvements in function in a reasonable and predictable amount of time.     Precautions / Restrictions Precautions Precautions: Fall Restrictions Weight Bearing Restrictions: No      Mobility  Bed  Mobility Overal bed mobility: Needs Assistance Bed Mobility: Supine to Sit, Sit to Supine     Supine to sit: Min assist Sit to supine: Min assist   General bed mobility comments: assistance for trunk support to sit upright. assistance for LE support to return to bed. verbal cues for technique    Transfers Overall transfer level: Needs assistance Equipment used: Rolling walker (2 wheels) Transfers: Sit to/from Stand Sit to Stand: Min assist           General transfer comment: lifting assistance required to stand. verbal cues for technique    Ambulation/Gait Ambulation/Gait assistance: Supervision Gait Distance (Feet): 75 Feet Assistive device: Rolling walker (2 wheels) Gait Pattern/deviations: Decreased stride length, Trunk flexed Gait velocity: decreased     General Gait Details: patient ambulated several laps in the room with slow but steady cadence. no physical assistance required. no leg pain reported with ambulation. encouraged patient to continue using walker for safety with ambulation  Stairs            Wheelchair Mobility    Modified Rankin (Stroke Patients Only)       Balance Overall balance assessment: Needs assistance Sitting-balance support: Feet supported Sitting balance-Leahy Scale: Good     Standing balance support: Bilateral upper extremity supported Standing balance-Leahy Scale: Fair Standing balance comment: using rolling walker for support in standing                             Pertinent Vitals/Pain Pain Assessment Pain Assessment: No/denies pain    Home Living Family/patient expects to be discharged to:: Private residence Living Arrangements: Spouse/significant other Available Help at Discharge: Family Type of Home: House Home Access: Stairs to enter  Entrance Stairs-Number of Steps: "a few"   Home Layout: Able to live on main level with bedroom/bathroom Home Equipment: Rolling Walker (2 wheels);Rollator (4  wheels);Gilmer Mor - single point Additional Comments: currently is here visiting his daughter and lives out of state. spouse travelled with him by plane    Prior Function Prior Level of Function : Independent/Modified Independent             Mobility Comments: using rollator for ambulation       Hand Dominance        Extremity/Trunk Assessment   Upper Extremity Assessment Upper Extremity Assessment: Overall WFL for tasks assessed    Lower Extremity Assessment Lower Extremity Assessment: Generalized weakness;RLE deficits/detail RLE Deficits / Details: noted redness distally. able to activate ankle, hip, knee movement. reports chronic weakness following lumbar surgery earlier in the year       Communication   Communication: No difficulties  Cognition Arousal/Alertness: Awake/alert Behavior During Therapy: WFL for tasks assessed/performed Overall Cognitive Status: Within Functional Limits for tasks assessed                                          General Comments General comments (skin integrity, edema, etc.): patient is hopeful to discharge home soon. patient wants to walk again later today with staff assistance    Exercises     Assessment/Plan    PT Assessment Patient needs continued PT services  PT Problem List Decreased strength;Decreased range of motion;Decreased activity tolerance;Decreased balance;Decreased mobility       PT Treatment Interventions DME instruction;Gait training;Stair training;Functional mobility training;Therapeutic activities;Therapeutic exercise;Balance training;Neuromuscular re-education;Patient/family education    PT Goals (Current goals can be found in the Care Plan section)  Acute Rehab PT Goals Patient Stated Goal: to walk more and get out of the hospital PT Goal Formulation: With patient Time For Goal Achievement: 04/27/23 Potential to Achieve Goals: Good    Frequency Min 3X/week     Co-evaluation                AM-PAC PT "6 Clicks" Mobility  Outcome Measure Help needed turning from your back to your side while in a flat bed without using bedrails?: A Little Help needed moving from lying on your back to sitting on the side of a flat bed without using bedrails?: A Little Help needed moving to and from a bed to a chair (including a wheelchair)?: A Little Help needed standing up from a chair using your arms (e.g., wheelchair or bedside chair)?: A Little Help needed to walk in hospital room?: A Little Help needed climbing 3-5 steps with a railing? : A Little 6 Click Score: 18    End of Session   Activity Tolerance: Patient tolerated treatment well Patient left: in bed;with call bell/phone within reach;with bed alarm set;with family/visitor present   PT Visit Diagnosis: Muscle weakness (generalized) (M62.81);Other abnormalities of gait and mobility (R26.89)    Time: 1610-9604 PT Time Calculation (min) (ACUTE ONLY): 20 min   Charges:   PT Evaluation $PT Eval Low Complexity: 1 Low PT Treatments $Therapeutic Activity: 8-22 mins       Donna Bernard, PT, MPT   Ina Homes 04/13/2023, 12:26 PM

## 2023-04-13 NOTE — Discharge Instructions (Signed)

## 2023-04-13 NOTE — Consult Note (Signed)
[facility]    Date of Admission:  04/10/2023     Total days of antibiotics 4               Reason for Consult: Cellulitis    Referring Provider: Dr. Thad Ranger Primary Care Provider: Pcp, No   ASSESSMENT:  Brent Arnold is an 82 y/o male with recent history of Klebsiella/Serratia pneumonia treated with meropenem and course complicated by development of cellulitis of the right lower extremity that was refractory to 4 days of cephalexin outpatient and minimal improvement with vancomycin and cefepime. Continues to have erythema and edema and appears consistent with erysipelas and most likely organism is Streptococcus species. Will narrow antibiotics down Cefazolin and likely Cefadroxil with continued improved. Suspect this is likely related to fluid overload and slow to resolve. Educated about importance of keeping leg elevated. Will await vascular testing. Remaining medical and supportive care per Internal Medicine.   PLAN:  Narrow antibiotics to Cefazolin. Leg elevation. Monitor blood cultures. Await results of ABIs. Remaining medical and supportive care per Internal Medicine.    Principal Problem:   Cellulitis of right leg Active Problems:   Antiphospholipid antibody syndrome (HCC)   Hypertension   Crohn disease (HCC)   Benign prostatic hyperplasia with lower urinary tract symptoms   Hyperlipidemia, unspecified   Morbid (severe) obesity due to excess calories (HCC)    apixaban  5 mg Oral BID   atorvastatin  40 mg Oral Daily   budesonide  3 mg Oral BID   finasteride  5 mg Oral Daily   furosemide  40 mg Oral Daily   metoprolol succinate  25 mg Oral Daily   montelukast  10 mg Oral QHS   pantoprazole  40 mg Oral Daily   spironolactone  25 mg Oral Daily   tamsulosin  0.4 mg Oral Daily     HPI: Brent Arnold is a 82 y.o. male with previous medical history of hypertension, Crohn's disease, CAD s/p CABG, antiphospholipid syndrome on Eliquis  and recent history of pneumonia presenting to the hospital with worsening right lower extremity redness and swelling.  Brent Arnold was recently admitted to the hospital in [place], [place] from 03/21/23-03/29/23 for failed outpatient treatment of pneumonia where Brent Arnold was treated with Augmentin and prednisone. Seen by Pulmonology outpatient with sputum cultures growing Serratia marcescens and Klebsiella oxytoca. Started on levofloxacin inpatient which was changed to Vancomycin and piperacillin-tazobactam and then to vancomycin and meropenem. Completed a 14 day course of meropenem with recommended follow up CT scan in 6 weeks. Completed meropenem on 04/07/23 and presented to ED on 04/07/23 with bilateral leg swelling (R>L) and retained about 3 lbs of fluid which was believed to be related to swelling and stasis with DVT work up negative. Followed up with Internal Medicine on 04/07/23 and diagnosed with cellulitis and prescribed cephalexin 500 mg tid x 7 days.   Presented to the ED on 04/10/23 with feelings of worsening redness and swelling with good adherence and tolerance to cephalexin. Afebrile since admission with mild leukocytosis with WBC count 12.5. X-ray tibia/fibula with diffuse soft tissue swelling and edema through the lower extremity and osseus abnormality. Blood cultures have been without growth to date and currently on Day 4 of vancomycin and cefepime. Continues to have edema and erythema of the right lower extremity and without fever.    Review of Systems: Review of Systems  Constitutional:  Negative for chills, fever and weight loss.  Respiratory:  Negative for cough, shortness of breath and wheezing.   Cardiovascular:  Positive for leg swelling. Negative for chest pain.  Gastrointestinal:  Negative for abdominal pain, constipation, diarrhea, nausea and vomiting.  Skin:  Negative for rash.     Past Medical History:  Diagnosis Date   Antiphospholipid antibody syndrome (HCC)    Arthritis     Asthma    Crohn disease (HCC)    DVT (deep venous thrombosis) (HCC)    Hypertension     Social History   Tobacco Use   Smoking status: Never   Smokeless tobacco: Never  Substance Use Topics   Alcohol use: Never   Drug use: Never    History reviewed. No pertinent family history.  Allergies  Allergen Reactions   Dilantin [Phenytoin] Hives   Gabapentin Rash    OBJECTIVE: Blood pressure 123/65, pulse 70, temperature 98 F (36.7 C), resp. rate 18, height 5\' 10"  (1.778 m), weight 104.3 kg, SpO2 96 %.  Physical Exam Constitutional:      General: Brent Arnold is not in acute distress.    Appearance: Brent Arnold is well-developed.  Cardiovascular:     Rate and Rhythm: Normal rate and regular rhythm.     Heart sounds: Normal heart sounds.  Pulmonary:     Effort: Pulmonary effort is normal.     Breath sounds: Normal breath sounds.  Skin:    General: Skin is warm and dry.  Neurological:     Mental Status: Brent Arnold is alert and oriented to person, place, and time.  Psychiatric:        Mood and Affect: Mood normal.      Lab Results Lab Results  Component Value Date   WBC 12.5 (H) 04/13/2023   HGB 13.4 04/13/2023   HCT 42.5 04/13/2023   MCV 80.2 04/13/2023   PLT 238 04/13/2023    Lab Results  Component Value Date   CREATININE 0.82 04/13/2023   BUN 19 04/13/2023   NA 135 04/13/2023   K 3.9 04/13/2023   CL 103 04/13/2023   CO2 21 (L) 04/13/2023    Lab Results  Component Value Date   ALT 26 04/10/2023   AST 29 04/10/2023   ALKPHOS 50 04/10/2023   BILITOT 1.2 04/10/2023     Microbiology: Recent Results (from the past 240 hour(s))  Culture, blood (Routine x 2)     Status: None (Preliminary result)   Collection Time: 04/10/23  5:05 PM   Specimen: BLOOD RIGHT HAND  Result Value Ref Range Status   Specimen Description BLOOD RIGHT HAND  Final   Special Requests   Final    BOTTLES DRAWN AEROBIC AND ANAEROBIC Blood Culture adequate volume   Culture   Final    NO GROWTH 3  DAYS Performed at [facility], [address] [place], [place] [address]    Report Status PENDING  Incomplete  Culture, blood (Routine x 2)     Status: None (Preliminary result)   Collection Time: 04/10/23  6:25 PM   Specimen: BLOOD LEFT FOREARM  Result Value Ref Range Status   Specimen Description BLOOD LEFT FOREARM  Final   Special Requests   Final    BOTTLES DRAWN AEROBIC AND ANAEROBIC Blood Culture results may not be optimal due to an inadequate volume of blood received in culture bottles   Culture   Final    NO GROWTH 3 DAYS Performed at [facility], [address], [place], [place] [address]    Report Status PENDING  Incomplete     Marcos Eke, NP [facility]  04/13/2023  11:58 AM

## 2023-04-13 NOTE — Progress Notes (Signed)
ABI study completed.   Please see CV Procedures for preliminary results.  Diontay Rosencrans, RVT  12:07 PM 04/13/23

## 2023-04-14 DIAGNOSIS — N401 Enlarged prostate with lower urinary tract symptoms: Secondary | ICD-10-CM | POA: Diagnosis not present

## 2023-04-14 DIAGNOSIS — D6861 Antiphospholipid syndrome: Secondary | ICD-10-CM | POA: Diagnosis not present

## 2023-04-14 DIAGNOSIS — L03115 Cellulitis of right lower limb: Secondary | ICD-10-CM | POA: Diagnosis not present

## 2023-04-14 LAB — BASIC METABOLIC PANEL
Anion gap: 10 (ref 5–15)
BUN: 19 mg/dL (ref 8–23)
CO2: 22 mmol/L (ref 22–32)
Calcium: 8.6 mg/dL — ABNORMAL LOW (ref 8.9–10.3)
Chloride: 102 mmol/L (ref 98–111)
Creatinine, Ser: 0.79 mg/dL (ref 0.61–1.24)
GFR, Estimated: >60 mL/min (ref >60)
Glucose, Bld: 121 mg/dL — ABNORMAL HIGH (ref 70–99)
Potassium: 3.7 mmol/L (ref 3.5–5.1)
Sodium: 134 mmol/L — ABNORMAL LOW (ref 135–145)

## 2023-04-14 LAB — CBC WITH DIFFERENTIAL/PLATELET
Abs Immature Granulocytes: 0.17 10*3/uL — ABNORMAL HIGH (ref 0.00–0.07)
Basophils Absolute: 0.1 10*3/uL (ref 0.0–0.1)
Basophils Relative: 0 %
Eosinophils Absolute: 0.4 10*3/uL (ref 0.0–0.5)
Eosinophils Relative: 3 %
HCT: 43 % (ref 39.0–52.0)
Hemoglobin: 13.5 g/dL (ref 13.0–17.0)
Immature Granulocytes: 1 %
Lymphocytes Relative: 6 %
Lymphs Abs: 0.8 10*3/uL (ref 0.7–4.0)
MCH: 25.2 pg — ABNORMAL LOW (ref 26.0–34.0)
MCHC: 31.4 g/dL (ref 30.0–36.0)
MCV: 80.2 fL (ref 80.0–100.0)
Monocytes Absolute: 1.4 10*3/uL — ABNORMAL HIGH (ref 0.1–1.0)
Monocytes Relative: 11 %
Neutro Abs: 10 10*3/uL — ABNORMAL HIGH (ref 1.7–7.7)
Neutrophils Relative %: 79 %
Platelets: 264 10*3/uL (ref 150–400)
RBC: 5.36 MIL/uL (ref 4.22–5.81)
RDW: 24.2 % — ABNORMAL HIGH (ref 11.5–15.5)
WBC: 12.7 10*3/uL — ABNORMAL HIGH (ref 4.0–10.5)
nRBC: 0 % (ref 0.0–0.2)

## 2023-04-14 LAB — MAGNESIUM: Magnesium: 1.9 mg/dL (ref 1.7–2.4)

## 2023-04-14 MED ORDER — FUROSEMIDE 40 MG PO TABS
40.0000 mg | ORAL_TABLET | Freq: Every day | ORAL | Status: AC
  Administered 2023-04-14 – 2023-04-15 (×2): 40 mg via ORAL
  Filled 2023-04-14 (×2): qty 1

## 2023-04-14 NOTE — Progress Notes (Signed)
[facility]  Date of Admission:  04/10/2023     Total days of antibiotics 5         ASSESSMENT:  Mr. Alderman right lower leg appears slightly improved compared to yesterday and continues to have erythema and and edema. Discussed with family regarding change in antibiotics and will continue to monitor on Cefazolin for now and if no improvement will consider addition of doxycycline and possibly biopsy for further evaluation. Continue to keep leg elevated. Remaining medical and supportive care per Internal Medicine.   PLAN:  Continue current dose of Cefazolin. Continue to elevate leg. Monitor blood cultures for bacteremia.  Remaining medical and supportive care per Internal Medicine.   Principal Problem:   Cellulitis of right leg Active Problems:   Antiphospholipid antibody syndrome (HCC)   Hypertension   Crohn disease (HCC)   Benign prostatic hyperplasia with lower urinary tract symptoms   Hyperlipidemia, unspecified   Morbid (severe) obesity due to excess calories (HCC)    apixaban  5 mg Oral BID   atorvastatin  40 mg Oral Daily   budesonide  3 mg Oral BID   finasteride  5 mg Oral Daily   furosemide  40 mg Oral Daily   metoprolol succinate  25 mg Oral Daily   montelukast  10 mg Oral QHS   pantoprazole  40 mg Oral Daily   spironolactone  25 mg Oral Daily   tamsulosin  0.4 mg Oral Daily    SUBJECTIVE:  Afebrile overnight with no acute events. Wife at bedside and called son on the phone.   Allergies  Allergen Reactions   Dilantin [Phenytoin] Hives   Gabapentin Rash     Review of Systems: Review of Systems  Constitutional:  Negative for chills, fever and weight loss.  Respiratory:  Negative for cough, shortness of breath and wheezing.   Cardiovascular:  Positive for leg swelling. Negative for chest pain.  Gastrointestinal:  Negative for abdominal pain, constipation, diarrhea, nausea and vomiting.  Skin:  Negative for rash.       OBJECTIVE: Vitals:   04/13/23 2100 04/13/23 2300 04/14/23 0411 04/14/23 0738  BP:   119/69 114/80  Pulse:   76 72  Resp:   17 18  Temp: 99.6 F (37.6 C) 99.7 F (37.6 C) 98.2 F (36.8 C) 98.2 F (36.8 C)  TempSrc:      SpO2:   95% 98%  Weight:      Height:       Body mass index is 33 kg/m.  Physical Exam Constitutional:      General: He is not in acute distress.    Appearance: He is well-developed.  Cardiovascular:     Rate and Rhythm: Normal rate and regular rhythm.     Heart sounds: Normal heart sounds.  Pulmonary:     Effort: Pulmonary effort is normal.     Breath sounds: Normal breath sounds.  Skin:    General: Skin is warm and dry.  Neurological:     Mental Status: He is alert.  Psychiatric:        Mood and Affect: Mood normal.      Lab Results Lab Results  Component Value Date   WBC 12.7 (H) 04/14/2023   HGB 13.5 04/14/2023   HCT 43.0 04/14/2023   MCV 80.2 04/14/2023   PLT 264 04/14/2023    Lab Results  Component Value Date   CREATININE 0.79 04/14/2023   BUN 19 04/14/2023   NA 134 (  L) 04/14/2023   K 3.7 04/14/2023   CL 102 04/14/2023   CO2 22 04/14/2023    Lab Results  Component Value Date   ALT 26 04/10/2023   AST 29 04/10/2023   ALKPHOS 50 04/10/2023   BILITOT 1.2 04/10/2023     Microbiology: Recent Results (from the past 240 hour(s))  Culture, blood (Routine x 2)     Status: None (Preliminary result)   Collection Time: 04/10/23  5:05 PM   Specimen: BLOOD RIGHT HAND  Result Value Ref Range Status   Specimen Description BLOOD RIGHT HAND  Final   Special Requests   Final    BOTTLES DRAWN AEROBIC AND ANAEROBIC Blood Culture adequate volume   Culture   Final    NO GROWTH 4 DAYS Performed at [facility], [address], [place], [place] [address]    Report Status PENDING  Incomplete  Culture, blood (Routine x 2)     Status: None (Preliminary result)   Collection Time: 04/10/23  6:25 PM   Specimen: BLOOD LEFT FOREARM   Result Value Ref Range Status   Specimen Description BLOOD LEFT FOREARM  Final   Special Requests   Final    BOTTLES DRAWN AEROBIC AND ANAEROBIC Blood Culture results may not be optimal due to an inadequate volume of blood received in culture bottles   Culture   Final    NO GROWTH 4 DAYS Performed at [facility], [address] [place], [place] [address]    Report Status PENDING  Incomplete     Marcos Eke, NP [facility]  04/14/2023  1:14 PM

## 2023-04-14 NOTE — Plan of Care (Signed)

## 2023-04-14 NOTE — Progress Notes (Signed)
[facility]                                                                              Brent Arnold, is a 82 y.o. male, DOB - [birth year], AOZ:[id number] Admit date - 04/10/2023    Outpatient Primary MD for the patient is Pcp, No  LOS - 3  days  Chief Complaint  Patient presents with   Cellulitis       Brief summary  Patient is a 82 year old male with hypertension, antiphospholipid antibody syndrome on chronic Eliquis, history of DVT, Crohn's disease, BPH, coronary disease status post CABG who presented to the ER with worsening cellulitis of his right leg.   Patient is from [place], [place].  He was admitted to the hospital from 03/21/23-03/29/23.  He was treated for Klebsiella and Serratia pneumonia.  He was placed on IV meropenem at discharge.  He completed 10 additional days of IV meropenem with PICC line.  On the day he stopped his meropenem, he developed cellulitis of his right leg.  He was seen in clinic the next day and started on p.o. Keflex.  Patient had already bought tickets to come visit his daughter who lives here in [place] . He has noted since arriving to [facility] his cellulitis had continued to worsen.  He has been having low-grade fevers at home.  Increasing swelling and erythema.  Patient brought to the ER by his daughter.   Patient was placed on IV vancomycin and cefepime, admitted for further workup..    Assessment & Plan    Principal Problem:   Cellulitis of right leg with outpatient failure to treatment -Patient was on Keflex for 4 days outpatient with no improvement and was on IV meropenem prior to transitioning to Keflex. -Venous Dopplers negative for DVT, ABIs showed right and left toe brachial index normal. -ID consulted, appears Streptococcus with bright red erythema, edema and warmth, not typical of MRSA or staph. -Continue IV cefazolin, ID following  -Continue Lasix for 2 days.  Creatinine stable  Active Problems:    Antiphospholipid antibody syndrome (HCC) -Continue eliquis, chronic    Hypertension -BP stable, continue Toprol-XL    Crohn disease (HCC) -Stable, no acute issues    Benign prostatic hyperplasia with lower urinary tract symptoms -Continue Flomax    Hyperlipidemia, unspecified Stable   Morbid obesity  Estimated body mass index is 33 kg/m as calculated from the following:   Height as of this encounter: 5\' 10"  (1.778 m).   Weight as of this encounter: 104.3 kg.  Code Status: Full code DVT Prophylaxis:  SCDs Start: 04/10/23 2303 apixaban (ELIQUIS) tablet 5 mg   Level of Care: Level of care: Med-Surg Family Communication: Updated patient's son on the phone (Dr. Truddie Crumble, phone #[phone]) on 5/1, updated patient's daughter today Disposition Plan:      Remains inpatient appropriate:   Procedures:  Venous Dopplers 4/28 negative for DVT  Consultants:   Infectious disease  Antimicrobials:   Anti-infectives (From admission, onward)    Start     Dose/Rate Route Frequency Ordered Stop   04/13/23 1400  ceFAZolin (ANCEF) IVPB 2g/100 mL premix  2 g 200 mL/hr over 30 Minutes Intravenous Every 8 hours 04/13/23 1101     04/11/23 2145  vancomycin (VANCOREADY) IVPB 1750 mg/350 mL  Status:  Discontinued       See Hyperspace for full Linked Orders Report.   1,750 mg 175 mL/hr over 120 Minutes Intravenous Every 24 hours 04/10/23 2132 04/13/23 1101   04/10/23 2145  ceFEPIme (MAXIPIME) 2 g in sodium chloride 0.9 % 100 mL IVPB  Status:  Discontinued        2 g 200 mL/hr over 30 Minutes Intravenous Every 8 hours 04/10/23 2132 04/13/23 1101   04/10/23 2145  vancomycin (VANCOREADY) IVPB 2000 mg/400 mL       See Hyperspace for full Linked Orders Report.   2,000 mg 200 mL/hr over 120 Minutes Intravenous  Once 04/10/23 2132 04/11/23 0029   04/10/23 2030  vancomycin (VANCOCIN) IVPB 1000 mg/200 mL premix  Status:  Discontinued        1,000 mg 200 mL/hr over 60 Minutes Intravenous   Once 04/10/23 2026 04/10/23 2130   04/10/23 2030  ceFEPIme (MAXIPIME) 2 g in sodium chloride 0.9 % 100 mL IVPB  Status:  Discontinued        2 g 200 mL/hr over 30 Minutes Intravenous  Once 04/10/23 2026 04/10/23 2130          Medications  apixaban  5 mg Oral BID   atorvastatin  40 mg Oral Daily   budesonide  3 mg Oral BID   finasteride  5 mg Oral Daily   furosemide  40 mg Oral Daily   metoprolol succinate  25 mg Oral Daily   montelukast  10 mg Oral QHS   pantoprazole  40 mg Oral Daily   spironolactone  25 mg Oral Daily   tamsulosin  0.4 mg Oral Daily      Subjective:   Brent Arnold was seen and examined today.  Still has erythema, right lower leg no fevers or chills.  Edema improving.   Patient denies dizziness, chest pain, shortness of breath, abdominal pain, N/V.  No fevers.  Objective:   Vitals:   04/13/23 2100 04/13/23 2300 04/14/23 0411 04/14/23 0738  BP:   119/69 114/80  Pulse:   76 72  Resp:   17 18  Temp: 99.6 F (37.6 C) 99.7 F (37.6 C) 98.2 F (36.8 C) 98.2 F (36.8 C)  TempSrc:      SpO2:   95% 98%  Weight:      Height:        Intake/Output Summary (Last 24 hours) at 04/14/2023 1328 Last data filed at 04/13/2023 2330 Gross per 24 hour  Intake --  Output 300 ml  Net -300 ml     Wt Readings from Last 3 Encounters:  04/10/23 104.3 kg   Physical Exam General: Alert and oriented x 3, NAD Cardiovascular: S1 S2 clear, RRR.  Respiratory: CTAB Gastrointestinal: Soft, nontender, nondistended, NBS Ext: 1+ pedal edema bilaterally Neuro: no new deficits Skin: Erythema, warmth of the right lower leg, Psych: Normal affect       Data Reviewed:  I have personally reviewed following labs    CBC Lab Results  Component Value Date   WBC 12.7 (H) 04/14/2023   RBC 5.36 04/14/2023   HGB 13.5 04/14/2023   HCT 43.0 04/14/2023   MCV 80.2 04/14/2023   MCH 25.2 (L) 04/14/2023   PLT 264 04/14/2023   MCHC 31.4 04/14/2023   RDW 24.2 (H) 04/14/2023    LYMPHSABS  0.8 04/14/2023   MONOABS 1.4 (H) 04/14/2023   EOSABS 0.4 04/14/2023   BASOSABS 0.1 04/14/2023     Last metabolic panel Lab Results  Component Value Date   NA 134 (L) 04/14/2023   K 3.7 04/14/2023   CL 102 04/14/2023   CO2 22 04/14/2023   BUN 19 04/14/2023   CREATININE 0.79 04/14/2023   GLUCOSE 121 (H) 04/14/2023   GFRNONAA >60 04/14/2023   GFRAA  09/02/2010    >60        The eGFR has been calculated using the MDRD equation. This calculation has not been validated in all clinical situations. eGFR's persistently <60 mL/min signify possible Chronic Kidney Disease.   CALCIUM 8.6 (L) 04/14/2023   PROT 7.1 04/10/2023   ALBUMIN 3.4 (L) 04/10/2023   BILITOT 1.2 04/10/2023   ALKPHOS 50 04/10/2023   AST 29 04/10/2023   ALT 26 04/10/2023   ANIONGAP 10 04/14/2023    CBG (last 3)  No results for input(s): "GLUCAP" in the last 72 hours.    Coagulation Profile: Recent Labs  Lab 04/10/23 1652  INR 1.3*     Radiology Studies: I have personally reviewed the imaging studies  VAS [place] ABI WITH/WO TBI  Result Date: 04/13/2023  LOWER EXTREMITY DOPPLER STUDY Patient Name:  Brent Arnold  Date of Exam:   04/13/2023 Medical Rec #: [id number]       Accession #:    [id number] Date of Birth: Jan 18, 1941       Patient Gender: M Patient Age:   23 years Exam Location:  [facility] Procedure:      VAS [place] ABI WITH/WO TBI Referring Phys: Carmino Ocain --------------------------------------------------------------------------------  Indications: Peripheral artery disease. High Risk Factors: Hypertension, no history of smoking.  Comparison Study: No previous study. Performing Technologist: McKayla Maag RVT, [place]  Examination Guidelines: A complete evaluation includes at minimum, Doppler waveform signals and systolic blood pressure reading at the level of bilateral brachial, anterior tibial, and posterior tibial arteries, when vessel segments are accessible. Bilateral testing is considered an  integral part of a complete examination. Photoelectric Plethysmograph (PPG) waveforms and toe systolic pressure readings are included as required and additional duplex testing as needed. Limited examinations for reoccurring indications may be performed as noted.  ABI Findings: +---------+------------------+-----+---------+----------------+ Right    Rt Pressure (mmHg)IndexWaveform Comment          +---------+------------------+-----+---------+----------------+ Brachial 141                    triphasic                 +---------+------------------+-----+---------+----------------+ PTA      255               1.81 triphasicNon-compressible +---------+------------------+-----+---------+----------------+ DP       255               1.81 triphasicNon-compressible +---------+------------------+-----+---------+----------------+ Great Toe130               0.92 Normal                    +---------+------------------+-----+---------+----------------+ +---------+------------------+-----+---------+----------------+ Left     Lt Pressure (mmHg)IndexWaveform Comment          +---------+------------------+-----+---------+----------------+ Brachial 137                    triphasic                 +---------+------------------+-----+---------+----------------+ PTA  255               1.81 triphasicNon-compressible +---------+------------------+-----+---------+----------------+ DP       255               1.81 triphasicNon-compressible +---------+------------------+-----+---------+----------------+ Great Toe120               0.85 Normal                    +---------+------------------+-----+---------+----------------+ +-------+----------------+-----------+------------+------------+ ABI/TBIToday's ABI     Today's TBIPrevious ABIPrevious TBI +-------+----------------+-----------+------------+------------+ Right  Non-compressible0.92                                 +-------+----------------+-----------+------------+------------+ Left   Non-compressible0.85                                +-------+----------------+-----------+------------+------------+   Summary: Right: Resting right ankle-brachial index indicates noncompressible right lower extremity arteries. The right toe-brachial index is normal. Left: Resting left ankle-brachial index indicates noncompressible left lower extremity arteries. The left toe-brachial index is normal. *See table(s) above for measurements and observations.  Electronically signed by Coral Else MD on 04/13/2023 at 9:24:08 PM.    Final        Thad Ranger M.D. [facility] 04/14/2023, 1:28 PM  Available via Epic secure chat 7am-7pm After 7 pm, please refer to night coverage provider listed on amion.

## 2023-04-15 DIAGNOSIS — D6861 Antiphospholipid syndrome: Secondary | ICD-10-CM | POA: Diagnosis not present

## 2023-04-15 DIAGNOSIS — L03115 Cellulitis of right lower limb: Secondary | ICD-10-CM | POA: Diagnosis not present

## 2023-04-15 DIAGNOSIS — K50919 Crohn's disease, unspecified, with unspecified complications: Secondary | ICD-10-CM | POA: Diagnosis not present

## 2023-04-15 DIAGNOSIS — N401 Enlarged prostate with lower urinary tract symptoms: Secondary | ICD-10-CM | POA: Diagnosis not present

## 2023-04-15 LAB — CBC WITH DIFFERENTIAL/PLATELET
Abs Immature Granulocytes: 0.19 10*3/uL — ABNORMAL HIGH (ref 0.00–0.07)
Basophils Absolute: 0.1 10*3/uL (ref 0.0–0.1)
Basophils Relative: 1 %
Eosinophils Absolute: 0.4 10*3/uL (ref 0.0–0.5)
Eosinophils Relative: 4 %
HCT: 42 % (ref 39.0–52.0)
Hemoglobin: 13.1 g/dL (ref 13.0–17.0)
Immature Granulocytes: 2 %
Lymphocytes Relative: 5 %
Lymphs Abs: 0.7 10*3/uL (ref 0.7–4.0)
MCH: 25.4 pg — ABNORMAL LOW (ref 26.0–34.0)
MCHC: 31.2 g/dL (ref 30.0–36.0)
MCV: 81.4 fL (ref 80.0–100.0)
Monocytes Absolute: 1.5 10*3/uL — ABNORMAL HIGH (ref 0.1–1.0)
Monocytes Relative: 13 %
Neutro Abs: 9.1 10*3/uL — ABNORMAL HIGH (ref 1.7–7.7)
Neutrophils Relative %: 75 %
Platelets: 281 10*3/uL (ref 150–400)
RBC: 5.16 MIL/uL (ref 4.22–5.81)
RDW: 23.9 % — ABNORMAL HIGH (ref 11.5–15.5)
WBC: 12 10*3/uL — ABNORMAL HIGH (ref 4.0–10.5)
nRBC: 0 % (ref 0.0–0.2)

## 2023-04-15 LAB — CULTURE, BLOOD (ROUTINE X 2): Special Requests: ADEQUATE

## 2023-04-15 LAB — BASIC METABOLIC PANEL
Anion gap: 11 (ref 5–15)
BUN: 19 mg/dL (ref 8–23)
CO2: 22 mmol/L (ref 22–32)
Calcium: 8.6 mg/dL — ABNORMAL LOW (ref 8.9–10.3)
Chloride: 102 mmol/L (ref 98–111)
Creatinine, Ser: 0.83 mg/dL (ref 0.61–1.24)
GFR, Estimated: >60 mL/min (ref >60)
Glucose, Bld: 133 mg/dL — ABNORMAL HIGH (ref 70–99)
Potassium: 3.5 mmol/L (ref 3.5–5.1)
Sodium: 135 mmol/L (ref 135–145)

## 2023-04-15 LAB — MAGNESIUM: Magnesium: 1.8 mg/dL (ref 1.7–2.4)

## 2023-04-15 MED ORDER — CEFADROXIL 500 MG PO CAPS: 1000.0000 mg | ORAL_CAPSULE | Freq: Two times a day (BID) | ORAL | 0 refills | Status: AC

## 2023-04-15 MED ORDER — ONDANSETRON HCL 4 MG PO TABS: 4.0000 mg | ORAL_TABLET | Freq: Four times a day (QID) | ORAL | 0 refills | Status: DC | PRN

## 2023-04-15 MED ORDER — PANTOPRAZOLE SODIUM 40 MG PO TBEC: 40.0000 mg | DELAYED_RELEASE_TABLET | Freq: Every day | ORAL | 0 refills | Status: DC

## 2023-04-15 NOTE — Discharge Summary (Signed)
Physician Discharge Summary   Patient: Brent Arnold MRN: [id number] DOB: 1941-06-11  Admit date:     04/10/2023  Discharge date: 04/15/23  Discharge Physician: Thad Ranger, MD    PCP: Pcp, No   Recommendations at discharge:   Cefadroxil 1000 mg p.o. twice daily for 14 days, outpatient follow-up scheduled with ID  Discharge Diagnoses:    Cellulitis of right leg   Antiphospholipid antibody syndrome (HCC)   Hypertension   Crohn disease (HCC)   Benign prostatic hyperplasia with lower urinary tract symptoms   Hyperlipidemia, unspecified   Morbid (severe) obesity due to excess calories [facility])    Hospital Course: Patient is a 82 year old male with hypertension, antiphospholipid antibody syndrome on chronic Eliquis, history of DVT, Crohn's disease, BPH, coronary disease status post CABG who presented to the ER with worsening cellulitis of his right leg.   Patient is from [place], [place].  He was admitted to the hospital from 03/21/23-03/29/23.  He was treated for Klebsiella and Serratia pneumonia.  He was placed on IV meropenem at discharge.  He completed 10 additional days of IV meropenem with PICC line.  On the day he stopped his meropenem, he developed cellulitis of his right leg.  He was seen in clinic the next day and started on p.o. Keflex.  Patient had already bought tickets to come visit his daughter who lives here in [place] . He has noted since arriving to [facility] his cellulitis had continued to worsen.  He has been having low-grade fevers at home.  Increasing swelling and erythema.  Patient brought to the ER by his daughter.   Patient was placed on IV antibiotics, admitted for further workup..    Assessment and Plan:   Cellulitis of right leg with outpatient failure to treatment/ erysipelas -Patient was on Keflex for 4 days outpatient with no improvement and was on IV meropenem prior to transitioning to Keflex. -Venous Dopplers negative for DVT, ABIs showed right and  left toe brachial index normal. -ID consulted, appears Streptococcus with bright red erythema, edema and warmth, not typical of MRSA or staph. -Patient was transition to IV cefazolin with improvement and responding.  Cleared by ID to discharge home with the cefadroxil 1000 mg p.o. twice daily for 14 more days Follow-up with Dr. Luciana Axe scheduled on 5/8.  Patient and family comfortable with the plan.    Antiphospholipid antibody syndrome (HCC) -Continue eliquis, chronic     Hypertension -BP stable, continue Toprol-XL     Crohn disease (HCC) -Stable, no acute issues     Benign prostatic hyperplasia with lower urinary tract symptoms -Continue Flomax     Hyperlipidemia, unspecified Stable    Morbid obesity  Estimated body mass index is 33 kg/m as calculated from the following:   Height as of this encounter: 5\' 10"  (1.778 m).   Weight as of this encounter: 104.3 kg.       Pain control - [employer] Controlled Substance Reporting System database was reviewed. and patient was instructed, not to drive, operate heavy machinery, perform activities at heights, swimming or participation in water activities or provide baby-sitting services while on Pain, Sleep and Anxiety Medications; until their outpatient Physician has advised to do so again. Also recommended to not to take more than prescribed Pain, Sleep and Anxiety Medications.  Consultants: Infectious disease Procedures performed: None Disposition: Home Diet recommendation:  Discharge Diet Orders (From admission, onward)     Start     Ordered   04/15/23 0000  Diet - low  sodium heart healthy        04/15/23 1246            DISCHARGE MEDICATION: Allergies as of 04/15/2023       Reactions   Dilantin [phenytoin] Hives   Gabapentin Rash        Medication List     STOP taking these medications    cephALEXin 500 MG capsule Commonly known as: KEFLEX       TAKE these medications    acetaminophen 650 MG CR  tablet Commonly known as: TYLENOL Take 650 mg by mouth at bedtime as needed for pain.   apixaban 5 MG Tabs tablet Commonly known as: ELIQUIS Take 5 mg by mouth 2 (two) times daily.   atorvastatin 40 MG tablet Commonly known as: LIPITOR Take 40 mg by mouth at bedtime.   budesonide 3 MG 24 hr capsule Commonly known as: ENTOCORT EC Take 3 mg by mouth 2 (two) times daily.   cefadroxil 500 MG capsule Commonly known as: DURICEF Take 2 capsules (1,000 mg total) by mouth 2 (two) times daily for 14 days.   celecoxib 100 MG capsule Commonly known as: CELEBREX Take 100 mg by mouth at bedtime.   Cholecalciferol 125 MCG (5000 UT) capsule Take 5,000 Units by mouth daily.   cyanocobalamin 1000 MCG/ML injection Commonly known as: VITAMIN B12 Inject 1,000 mcg into the muscle once a week. Wednesday   esomeprazole 40 MG capsule Commonly known as: NEXIUM Take 40 mg by mouth at bedtime.   finasteride 5 MG tablet Commonly known as: PROSCAR Take 5 mg by mouth at bedtime.   fluticasone furoate-vilanterol 200-25 MCG/ACT Aepb Commonly known as: BREO ELLIPTA Inhale 1 puff into the lungs daily.   ipratropium-albuterol 0.5-2.5 (3) MG/3ML Soln Commonly known as: DUONEB Inhale 3 mLs into the lungs every 4 (four) hours as needed (for wheezing and shortness of breath).   metoprolol succinate 50 MG 24 hr tablet Commonly known as: TOPROL-XL Take 25 mg by mouth at bedtime.   montelukast 10 MG tablet Commonly known as: SINGULAIR Take 10 mg by mouth at bedtime.   nitroGLYCERIN 0.4 MG SL tablet Commonly known as: NITROSTAT Place 0.4 mg under the tongue every 5 (five) minutes as needed for chest pain.   ondansetron 4 MG tablet Commonly known as: ZOFRAN Take 1 tablet (4 mg total) by mouth every 6 (six) hours as needed for nausea or vomiting.   spironolactone 25 MG tablet Commonly known as: ALDACTONE Take 25 mg by mouth daily.   tamsulosin 0.4 MG Caps capsule Commonly known as: FLOMAX Take  0.4 mg by mouth at bedtime.   testosterone cypionate 200 MG/ML injection Commonly known as: DEPOTESTOSTERONE CYPIONATE Inject 100 mg into the muscle once a week. Wednesday        Follow-up Information     [facility] Follow up.   Specialty: Home Health Services Why: [facility] will be providing home health services for PT.  They will call you and set up services in your daughter's home in the next 24-48 hours. Contact information: [address] [place] [address] [phone]         Gardiner Barefoot, MD Follow up on 04/20/2023.   Specialty: Infectious Diseases Why: at 8:45 AM, for hospital follow-up Contact information: [address] [place] [address] [phone]                Discharge Exam: Ceasar Mons Weights   04/10/23 1645  Weight: 104.3 kg   S: No acute complaints, right lower extremity redness improving.  No fevers  BP 132/72 (BP Location: Right Arm)   Pulse 72   Temp 97.8 F (36.6 C)   Resp 18   Ht 5\' 10"  (1.778 m)   Wt 104.3 kg   SpO2 97%   BMI 33.00 kg/m   Physical Exam General: Alert and oriented x 3, NAD Cardiovascular: S1 S2 clear, RRR.  Respiratory: CTAB, no wheezing, rales or rhonchi Gastrointestinal: Soft, nontender, nondistended, NBS Ext: no pedal edema bilaterally Skin: Right lower extremity redness improving, no warmth Psych: Normal affect    Condition at discharge: fair  The results of significant diagnostics from this hospitalization (including imaging, microbiology, ancillary and laboratory) are listed below for reference.   Imaging Studies: VAS [place] ABI WITH/WO TBI  Result Date: 04/13/2023  LOWER EXTREMITY DOPPLER STUDY Patient Name:  ADHARV SOWINSKI  Date of Exam:   04/13/2023 Medical Rec #: [id number]       Accession #:    [id number] Date of Birth: 01/10/1941       Patient Gender: M Patient Age:   29 years Exam Location:  [facility] Procedure:      VAS [place] ABI WITH/WO TBI  Referring Phys: Enjoli Tidd --------------------------------------------------------------------------------  Indications: Peripheral artery disease. High Risk Factors: Hypertension, no history of smoking.  Comparison Study: No previous study. Performing Technologist: McKayla Maag RVT, [place]  Examination Guidelines: A complete evaluation includes at minimum, Doppler waveform signals and systolic blood pressure reading at the level of bilateral brachial, anterior tibial, and posterior tibial arteries, when vessel segments are accessible. Bilateral testing is considered an integral part of a complete examination. Photoelectric Plethysmograph (PPG) waveforms and toe systolic pressure readings are included as required and additional duplex testing as needed. Limited examinations for reoccurring indications may be performed as noted.  ABI Findings: +---------+------------------+-----+---------+----------------+ Right    Rt Pressure (mmHg)IndexWaveform Comment          +---------+------------------+-----+---------+----------------+ Brachial 141                    triphasic                 +---------+------------------+-----+---------+----------------+ PTA      255               1.81 triphasicNon-compressible +---------+------------------+-----+---------+----------------+ DP       255               1.81 triphasicNon-compressible +---------+------------------+-----+---------+----------------+ Great Toe130               0.92 Normal                    +---------+------------------+-----+---------+----------------+ +---------+------------------+-----+---------+----------------+ Left     Lt Pressure (mmHg)IndexWaveform Comment          +---------+------------------+-----+---------+----------------+ Brachial 137                    triphasic                 +---------+------------------+-----+---------+----------------+ PTA      255               1.81 triphasicNon-compressible  +---------+------------------+-----+---------+----------------+ DP       255               1.81 triphasicNon-compressible +---------+------------------+-----+---------+----------------+ Great Toe120               0.85 Normal                    +---------+------------------+-----+---------+----------------+ +-------+----------------+-----------+------------+------------+  ABI/TBIToday's ABI     Today's TBIPrevious ABIPrevious TBI +-------+----------------+-----------+------------+------------+ Right  Non-compressible0.92                                +-------+----------------+-----------+------------+------------+ Left   Non-compressible0.85                                +-------+----------------+-----------+------------+------------+   Summary: Right: Resting right ankle-brachial index indicates noncompressible right lower extremity arteries. The right toe-brachial index is normal. Left: Resting left ankle-brachial index indicates noncompressible left lower extremity arteries. The left toe-brachial index is normal. *See table(s) above for measurements and observations.  Electronically signed by Coral Else MD on 04/13/2023 at 9:24:08 PM.    Final    VAS [place] LOWER EXTREMITY VENOUS (DVT) (ONLY [facility] & [facility])  Result Date: 04/11/2023  Lower Venous DVT Study Patient Name:  KASIEM ESHELMAN  Date of Exam:   04/10/2023 Medical Rec #: [id number]       Accession #:    [id number] Date of Birth: 1941/05/10       Patient Gender: M Patient Age:   4 years Exam Location:  [facility] Procedure:      VAS [place] LOWER EXTREMITY VENOUS (DVT) Referring Phys: Lorenda Cahill HENDERLY --------------------------------------------------------------------------------  Indications: Worsening pain, edema, erythema. Recently diagnosed with cellulitis at outside facility.  Comparison Study: No prior study on file at [facility]. However, most recent                   right LEV done at [facility] 04/07/2023 was negative  for                   DVT. Performing Technologist: Sherren Kerns RVS  Examination Guidelines: A complete evaluation includes B-mode imaging, spectral Doppler, color Doppler, and power Doppler as needed of all accessible portions of each vessel. Bilateral testing is considered an integral part of a complete examination. Limited examinations for reoccurring indications may be performed as noted. The reflux portion of the exam is performed with the patient in reverse Trendelenburg.  +---------+---------------+---------+-----------+----------+--------------+ RIGHT    CompressibilityPhasicitySpontaneityPropertiesThrombus Aging +---------+---------------+---------+-----------+----------+--------------+ CFV      Full           Yes      Yes                                 +---------+---------------+---------+-----------+----------+--------------+ SFJ      Full                                                        +---------+---------------+---------+-----------+----------+--------------+ FV Prox  Full                                                        +---------+---------------+---------+-----------+----------+--------------+ FV Mid   Full           Yes      Yes                                 +---------+---------------+---------+-----------+----------+--------------+  FV DistalFull                                                        +---------+---------------+---------+-----------+----------+--------------+ PFV      Full                                                        +---------+---------------+---------+-----------+----------+--------------+ POP      Full           Yes      Yes                                 +---------+---------------+---------+-----------+----------+--------------+ PTV      Full                                                        +---------+---------------+---------+-----------+----------+--------------+ PERO     Full                                                         +---------+---------------+---------+-----------+----------+--------------+   +----+---------------+---------+-----------+----------+--------------+ LEFTCompressibilityPhasicitySpontaneityPropertiesThrombus Aging +----+---------------+---------+-----------+----------+--------------+ CFV Full           Yes      Yes                                 +----+---------------+---------+-----------+----------+--------------+     Summary: RIGHT: - There is no evidence of deep vein thrombosis in the lower extremity.  LEFT: - No evidence of common femoral vein obstruction.  *See table(s) above for measurements and observations. Electronically signed by Coral Else MD on 04/11/2023 at 11:25:29 AM.    Final    DG Tibia/Fibula Right  Result Date: 04/10/2023 CLINICAL DATA:  Possible infection EXAM: RIGHT TIBIA AND FIBULA - 2 VIEW COMPARISON:  None. FINDINGS: There is diffuse soft tissue swelling and edema throughout the lower extremity. There is no evidence for foreign body or obvious gas collection. Peripheral vascular calcifications are present. There is no acute fracture or dislocation. No cortical erosions are identified. Sequelae of old trauma noted at the level of the medial malleolus. IMPRESSION: 1. Diffuse soft tissue swelling and edema throughout the lower extremity. 2. No acute osseous abnormality. Electronically Signed   By: Darliss Cheney M.D.   On: 04/10/2023 18:10    Microbiology: Results for orders placed or performed during the hospital encounter of 04/10/23  Culture, blood (Routine x 2)     Status: None   Collection Time: 04/10/23  5:05 PM   Specimen: BLOOD RIGHT HAND  Result Value Ref Range Status   Specimen Description BLOOD RIGHT HAND  Final   Special Requests   Final    BOTTLES DRAWN AEROBIC AND ANAEROBIC Blood Culture adequate volume  Culture   Final    NO GROWTH 5 DAYS Performed at [facility], [address]  [place], [place] [address]    Report Status 04/15/2023 FINAL  Final  Culture, blood (Routine x 2)     Status: None   Collection Time: 04/10/23  6:25 PM   Specimen: BLOOD LEFT FOREARM  Result Value Ref Range Status   Specimen Description BLOOD LEFT FOREARM  Final   Special Requests   Final    BOTTLES DRAWN AEROBIC AND ANAEROBIC Blood Culture results may not be optimal due to an inadequate volume of blood received in culture bottles   Culture   Final    NO GROWTH 5 DAYS Performed at [facility], [address], [place], [place] [address]    Report Status 04/15/2023 FINAL  Final    Labs: CBC: Recent Labs  Lab 04/10/23 1652 04/11/23 0500 04/13/23 0545 04/14/23 0754 04/15/23 0800  WBC 8.9 8.7 12.5* 12.7* 12.0*  NEUTROABS 7.1 6.8 9.9* 10.0* 9.1*  HGB 14.1 12.7* 13.4 13.5 13.1  HCT 44.9 42.4 42.5 43.0 42.0  MCV 80.8 83.8 80.2 80.2 81.4  PLT 214 200 238 264 281   Basic Metabolic Panel: Recent Labs  Lab 04/10/23 1652 04/11/23 0500 04/13/23 0545 04/14/23 0754 04/15/23 0800  NA 138  --  135 134* 135  K 4.1  --  3.9 3.7 3.5  CL 104  --  103 102 102  CO2 25  --  21* 22 22  GLUCOSE 116*  --  132* 121* 133*  BUN 27*  --  19 19 19   CREATININE 0.83  --  0.82 0.79 0.83  CALCIUM 9.3  --  8.4* 8.6* 8.6*  MG  --  2.1 1.8 1.9 1.8   Liver Function Tests: Recent Labs  Lab 04/10/23 1652  AST 29  ALT 26  ALKPHOS 50  BILITOT 1.2  PROT 7.1  ALBUMIN 3.4*   CBG: No results for input(s): "GLUCAP" in the last 168 hours.  Discharge time spent: greater than 30 minutes.  Signed: Thad Ranger, MD [facility] 04/15/2023

## 2023-04-15 NOTE — Progress Notes (Signed)
    [facility]   Reason for visit: Follow up on erysipelas/cellultis  Interval History: no new complaints; WBC 12.0, afebrile.   Day 6 total antibiotics  Physical Exam: Constitutional:  Vitals:   04/15/23 0500 04/15/23 0747  BP: 120/64 132/72  Pulse: 78 72  Resp: 16 18  Temp: 98.6 F (37 C) 97.8 F (36.6 C)  SpO2:  97%   patient appears in NAD Respiratory: Normal respiratory effort Skin: leg area with no warmth now, normal temperature, much more faint erythema  Review of Systems: Constitutional: negative for fevers and chills  Lab Results  Component Value Date   WBC 12.0 (H) 04/15/2023   HGB 13.1 04/15/2023   HCT 42.0 04/15/2023   MCV 81.4 04/15/2023   PLT 281 04/15/2023    Lab Results  Component Value Date   CREATININE 0.83 04/15/2023   BUN 19 04/15/2023   NA 135 04/15/2023   K 3.5 04/15/2023   CL 102 04/15/2023   CO2 22 04/15/2023    Lab Results  Component Value Date   ALT 26 04/10/2023   AST 29 04/10/2023   ALKPHOS 50 04/10/2023     Microbiology: Recent Results (from the past 240 hour(s))  Culture, blood (Routine x 2)     Status: None   Collection Time: 04/10/23  5:05 PM   Specimen: BLOOD RIGHT HAND  Result Value Ref Range Status   Specimen Description BLOOD RIGHT HAND  Final   Special Requests   Final    BOTTLES DRAWN AEROBIC AND ANAEROBIC Blood Culture adequate volume   Culture   Final    NO GROWTH 5 DAYS Performed at [facility], [address], [place], [place] [address]    Report Status 04/15/2023 FINAL  Final  Culture, blood (Routine x 2)     Status: None   Collection Time: 04/10/23  6:25 PM   Specimen: BLOOD LEFT FOREARM  Result Value Ref Range Status   Specimen Description BLOOD LEFT FOREARM  Final   Special Requests   Final    BOTTLES DRAWN AEROBIC AND ANAEROBIC Blood Culture results may not be optimal due to an inadequate volume of blood received in culture bottles   Culture   Final    NO GROWTH 5  DAYS Performed at [facility], [address] [place], [place] [address]    Report Status 04/15/2023 FINAL  Final    Impression/Plan:  1. Erysipelas = clinically it is much improved with less erythema, not warm anymore and no pain.   Clinically responding to cefazolin.  I recommend he continue with antibiotics with cefadroxil 1000 mg twice a day and would give him 14 more days for now, pending clinical response.   He has follow up with me next week on 5/8.   Discussed with Dr. Isidoro Donning and the patient and wife at the bedside who are comfortable with the plan.    I have personally spent 55 minutes involved in face-to-face and non-face-to-face activities for this patient on the day of the visit. Professional time spent includes the following activities: Preparing to see the patient (review of tests), Obtaining and/or reviewing separately obtained history (admission/discharge record), Performing a medically appropriate examination and/or evaluation , Ordering medications/tests/procedures, referring and communicating with other health care professionals, Documenting clinical information in the EMR, Independently interpreting results (not separately reported), Communicating results to the patient/family/caregiver, Counseling and educating the patient/family/caregiver and Care coordination (not separately reported).

## 2023-04-15 NOTE — Progress Notes (Signed)
Physical Therapy Treatment Patient Details Name: Brent Arnold MRN: [id number] DOB: [birth year] Today's Date: 04/15/2023   History of Present Illness Patient is a 82 year old male with history of hypertension, antiphospholipid antibody syndrome, DVT, Crohn's disease, BPH, CABG. Presents with worsening cellulitis of right leg.  Recent hospitalization for pneumonia.    PT Comments    Pt admitted with above diagnosis. Pt was able to ambulate with rollator with min assist with daugyhter and wife present.  They are aware that pt needs  mod assist for bed mobility and min assist with RW for ambulation with use of gait belt.  Has equipment and this PT issued gait belt. Pt currently with functional limitations due to balance and endurance deficits. Pt will benefit from acute skilled PT to increase their independence and safety with mobility to allow discharge.      Recommendations for follow up therapy are one component of a multi-disciplinary discharge planning process, led by the attending physician.  Recommendations may be updated based on patient status, additional functional criteria and insurance authorization.  Follow Up Recommendations       Assistance Recommended at Discharge Set up Supervision/Assistance  Patient can return home with the following A little help with walking and/or transfers;A little help with bathing/dressing/bathroom;Assistance with cooking/housework;Assist for transportation;Help with stairs or ramp for entrance   Equipment Recommendations  Other (comment);BSC/3in1 (gait belt)    Recommendations for Other Services       Precautions / Restrictions Precautions Precautions: Fall Restrictions Weight Bearing Restrictions: No     Mobility  Bed Mobility Overal bed mobility: Needs Assistance Bed Mobility: Supine to Sit, Sit to Supine     Supine to sit: Mod assist Sit to supine: Mod assist   General bed mobility comments: assistance for trunk support to sit  upright. assistance for LE support to return to bed. verbal cues for technique    Transfers Overall transfer level: Needs assistance Equipment used: Rolling walker (2 wheels) Transfers: Sit to/from Stand Sit to Stand: Min assist           General transfer comment: lifting assistance required to stand. verbal cues for technique    Ambulation/Gait Ambulation/Gait assistance: Min assist Gait Distance (Feet): 75 Feet Assistive device: Rollator (4 wheels) Gait Pattern/deviations: Decreased stride length, Trunk flexed, Wide base of support, Drifts right/left, Leaning posteriorly Gait velocity: decreased Gait velocity interpretation: 1.31 - 2.62 ft/sec, indicative of limited community ambulator   General Gait Details: patient ambulated with slow but steady cadence. needed physical assistance to slow rollator as pt tends to push it forward too much when fatigued. Pt needed constant cues to stand upright as he tends to flex at trunk, hips and knees. Obtained gait belt for family to use at home.   Stairs             Wheelchair Mobility    Modified Rankin (Stroke Patients Only)       Balance Overall balance assessment: Needs assistance Sitting-balance support: Feet supported Sitting balance-Leahy Scale: Good     Standing balance support: Bilateral upper extremity supported Standing balance-Leahy Scale: Fair Standing balance comment: using rolling walker for support in standing                            Cognition Arousal/Alertness: Awake/alert Behavior During Therapy: WFL for tasks assessed/performed Overall Cognitive Status: Within Functional Limits for tasks assessed  Exercises      General Comments        Pertinent Vitals/Pain Pain Assessment Pain Assessment: No/denies pain    Home Living                          Prior Function            PT Goals (current goals can now  be found in the care plan section) Acute Rehab PT Goals Patient Stated Goal: to walk more and get out of the hospital Progress towards PT goals: Progressing toward goals    Frequency    Min 3X/week      PT Plan Current plan remains appropriate    Co-evaluation              AM-PAC PT "6 Clicks" Mobility   Outcome Measure  Help needed turning from your back to your side while in a flat bed without using bedrails?: A Little Help needed moving from lying on your back to sitting on the side of a flat bed without using bedrails?: A Little Help needed moving to and from a bed to a chair (including a wheelchair)?: A Little Help needed standing up from a chair using your arms (e.g., wheelchair or bedside chair)?: A Little Help needed to walk in hospital room?: A Little Help needed climbing 3-5 steps with a railing? : A Little 6 Click Score: 18    End of Session Equipment Utilized During Treatment: Gait belt Activity Tolerance: Patient tolerated treatment well Patient left: in bed;with call bell/phone within reach;with bed alarm set;with family/visitor present Nurse Communication: Mobility status PT Visit Diagnosis: Muscle weakness (generalized) (M62.81);Other abnormalities of gait and mobility (R26.89)     Time: 1610-9604 PT Time Calculation (min) (ACUTE ONLY): 19 min  Charges:  $Gait Training: 8-22 mins                     [name] M,PT Acute Rehab Services [phone]    [name] 04/15/2023, 3:50 PM

## 2023-04-15 NOTE — Progress Notes (Signed)
Mobility Specialist Progress Note   04/15/23 1238  Mobility  Activity Ambulated with assistance to bathroom;Ambulated with assistance in hallway  Level of Assistance Moderate assist, patient does 50-74%  Assistive Device Four wheel walker (Rollator)  Distance Ambulated (ft) 230 ft  Activity Response Tolerated well  Mobility Referral Yes  $Mobility charge 1 Mobility   Received pt in bed having mild irritation in RLE but agreeable. MinG to EOB and MinA to stand. Pt having bowel incontinence while ambulating in room and required transfer to BR. ModA to stand from toilet. Ambulated in hallway w/ CG + cues on posture and proximity to Rollator. No faults throughout but pt having c/o fatigue and requiring PLB to recover. Returned back to chair w/ call bell in reach and chair alarm on.    Frederico Hamman Mobility Specialist Please contact via SecureChat or  Rehab office at [phone]

## 2023-04-15 NOTE — Care Management Important Message (Signed)
Important Message  Patient Details  Name: Brent Arnold MRN: [id number] Date of Birth: 30-Jun-1941   Medicare Important Message Given:  Yes     Dorena Bodo 04/15/2023, 2:52 PM

## 2023-04-20 ENCOUNTER — Other Ambulatory Visit: Payer: Self-pay

## 2023-04-20 ENCOUNTER — Encounter: Payer: Self-pay | Admitting: Internal Medicine

## 2023-04-20 ENCOUNTER — Ambulatory Visit (INDEPENDENT_AMBULATORY_CARE_PROVIDER_SITE_OTHER): Payer: Medicare PPO | Admitting: Internal Medicine

## 2023-04-20 VITALS — BP 111/77 | HR 59 | Temp 97.4°F | Ht 70.0 in | Wt 216.0 lb

## 2023-04-20 DIAGNOSIS — L03115 Cellulitis of right lower limb: Secondary | ICD-10-CM | POA: Diagnosis not present

## 2023-04-20 NOTE — Progress Notes (Signed)
   Subjective:    Patient ID: Brent Arnold, male    DOB: Oct 30, 1941, 82 y.o.   MRN: [id number]  HPI Brent Arnold is here for hospital follow up He was seen for cellulitis of the right lower leg and was treated initially with cephalexin 3 times a day and did not improve so presented to the ED.  He was admitted and was on vancomycin and cefepime then seen by me and I changed to cefazolin.  Looked most c/w erysipelas though no fever, no significant leukocytosis and improved.  He was discharged with two weeks of cefadroxil and here for reevaluation.  He had recently been hospitalized in [place] and had been on meropenem prior to the cellulitis.     Review of Systems  Constitutional:  Negative for chills and fever.  Gastrointestinal:  Negative for diarrhea.       Objective:   Physical Exam Skin:    Comments: Right leg with some residual erythema, blanching, no warmth, no tenderness, + edema  Left leg with a patch of erythema, blanching, no warmth, no tenderness, + edema  Neurological:     Mental Status: He is alert.           Assessment & Plan:

## 2023-04-20 NOTE — Assessment & Plan Note (Signed)
Resolved now with no further signs of active infection of his right leg.  There is some residual rash that blanches most c/w his circulatory issues.  No warmth or concerning signs.   I discussed that he can stop his antibiotics as it is resolved.   Continue with leg elevation and reduce swelling.   Return as needed.   I have personally spent 30 minutes involved in face-to-face and non-face-to-face activities for this patient on the day of the visit. Professional time spent includes the following activities: Preparing to see the patient (review of tests), Obtaining and/or reviewing separately obtained history (admission/discharge record), Performing a medically appropriate examination and/or evaluation , Ordering medications/tests/procedures, referring and communicating with other health care professionals, Documenting clinical information in the EMR, Independently interpreting results (not separately reported), Communicating results to the patient/family/caregiver, Counseling and educating the patient/family/caregiver and Care coordination (not separately reported).

## 2024-04-05 ENCOUNTER — Encounter: Payer: Self-pay | Admitting: Cardiology

## 2024-04-05 ENCOUNTER — Ambulatory Visit: Attending: Cardiology | Admitting: Cardiology

## 2024-04-05 VITALS — BP 92/58 | HR 59 | Ht 70.0 in | Wt 197.0 lb

## 2024-04-05 DIAGNOSIS — I255 Ischemic cardiomyopathy: Secondary | ICD-10-CM | POA: Diagnosis not present

## 2024-04-05 DIAGNOSIS — D6869 Other thrombophilia: Secondary | ICD-10-CM

## 2024-04-05 DIAGNOSIS — I5022 Chronic systolic (congestive) heart failure: Secondary | ICD-10-CM

## 2024-04-05 DIAGNOSIS — I10 Essential (primary) hypertension: Secondary | ICD-10-CM

## 2024-04-05 DIAGNOSIS — I480 Paroxysmal atrial fibrillation: Secondary | ICD-10-CM

## 2024-04-05 DIAGNOSIS — Z95810 Presence of automatic (implantable) cardiac defibrillator: Secondary | ICD-10-CM

## 2024-04-05 DIAGNOSIS — I442 Atrioventricular block, complete: Secondary | ICD-10-CM | POA: Diagnosis not present

## 2024-04-05 DIAGNOSIS — I38 Endocarditis, valve unspecified: Secondary | ICD-10-CM

## 2024-04-05 NOTE — Patient Instructions (Signed)
 Medication Instructions:  Your physician recommends that you continue on your current medications as directed. Please refer to the Current Medication list given to you today.  *If you need a refill on your cardiac medications before your next appointment, please call your pharmacy*  Follow-Up: At [facility], you and your health needs are our priority.  As part of our continuing mission to provide you with exceptional heart care, our providers are all part of one team.  This team includes your primary Cardiologist (physician) and Advanced Practice Providers or APPs (Physician Assistants and Nurse Practitioners) who all work together to provide you with the care you need, when you need it.  Your next appointment:   6 months  Provider:   Ardeen Kohler, MD     You have been referred to Dr. Alease Amend     1st Floor: - Lobby - Registration  - Pharmacy  - Lab - Cafe  2nd Floor: - PV Lab - Diagnostic Testing (echo, CT, nuclear med)  3rd Floor: - Vacant  4th Floor: - TCTS (cardiothoracic surgery) - AFib Clinic - Structural Heart Clinic - Vascular Surgery  - Vascular Ultrasound  5th Floor: - HeartCare Cardiology (general and EP) - Clinical Pharmacy for coumadin, hypertension, lipid, weight-loss medications, and med management appointments    Valet parking services will be available as well.

## 2024-04-05 NOTE — Progress Notes (Unsigned)
 Electrophysiology Office Note:   Date:  04/06/2024  ID:  THADD APUZZO, DOB January 31, 1941, MRN [id number]  Primary Cardiologist: None Electrophysiologist: Ardeen Kohler, MD      History of Present Illness:   Brent Arnold is a 83 y.o. 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  Discussed the use of AI scribe software for clinical note transcription with the patient, who gave verbal consent to proceed.  History of Present Illness Patient is relocating to [facility] to be close to his daughter.  He is hoping to establish care with our cardiology group.  He has a complex cardiac history with chronic heart failure requiring hospitalizations.  He underwent CRT-D implant earlier this year in the setting of complete heart block and low LVEF.He has a history of heart attack, stroke, and a significant blood clot from his hip to his ankle. Initial workup suggested lupus anticoagulant, but subsequent testing by another hematologist did not confirm this diagnosis. He has been on anticoagulation therapy for an extended period, initially on warfarin and now on Eliquis  indefinitely. His current medications include metoprolol , Jardiance, Entresto, spironolactone , and torsemide. He has been out of the hospital since mid-April.  No worsening of his baseline shortness of breath or lower extremity edema.  No chest pain.  No new or acute complaints today.  Review of systems complete and found to be negative unless listed in HPI.   EP Information / Studies Reviewed:    EKG is ordered today. Personal review as below.  AV paced rhythm, consistent with a biventricular paced morphology.  EKG Interpretation Date/Time:  Thursday April 05 2024 15:47:51 EDT Ventricular Rate:  59 PR Interval:  176 QRS Duration:  166 QT Interval:  502 QTC Calculation: 496 R  Axis:   -30  Text Interpretation: AV dual-paced rhythm Biventricular pacemaker detected No previous ECGs available Confirmed by Ardeen Kohler [phone]) on 04/06/2024 5:09:09 PM   Echo 01/11/24:    Technically difficult study due to patient body habitus and patient's  clinical status.  Definity ultrasound enhancing agent used.Definity used    Moderately decreased left ventricular systolic function. 2D Simpson's  biplane EF 36%. Septal wall motion consistent with:  post-operative state  .  Segmental wall motion abnormalities as described in the diagram below.   Mild left ventricular hypertrophy.    Aortic Valve: Bulky sclerosis/calcification of the non coronary aortic  valve cusp without stenosis or insufficiency. Peak velocity: 1.3 m/s. Mean  gradient: 4 mmHg. AVA: 2.87 cm2. Velocity ratio: 0.73.    Bubble study is negative for intracardiac shunt.    The RVSP is estimated at 31 mmHg.    Prior echo 01/02/24. EF 30%. RVSP 57   [facility] catheterization December 28, 2023:  "The SVG to the distal right goes to small territory 10 area that is clearly infarcted by LV-gram and of no clinical significance in his nonobstructive. SVG to diagonal is widely patent LIMA to LAD is widely patent. The left main is subtotally occluded 99% heavily calcified proximal circumflex heavily calcified 90% supplying small obtuse marginal and AV groove circumflex."  Physical Exam:   VS:  BP (!) 92/58   Pulse (!) 59   Ht 5\' 10"  (1.778 m)   Wt 197 lb (89.4 kg)   SpO2 96%   BMI 28.27 kg/m    Wt Readings from Last 3 Encounters:  04/05/24 197  lb (89.4 kg)  04/20/23 216 lb (98 kg)  04/10/23 230 lb (104.3 kg)     GEN: Well nourished, well developed in no acute distress NECK: No JVD CARDIAC: Normal rate, regular rhythm. RESPIRATORY:  Clear to auscultation without rales, wheezing or rhonchi  ABDOMEN: Soft, non-distended EXTREMITIES:  No edema; No deformity   ASSESSMENT AND PLAN:    #. S/p CRT-D:  Device: [place]  Resonate HF CRT-D Model: G547 Serial: [id number] Date: 01-10-24 RA Lead: [place] Ingevity Plus Model: [phone] Serial: [id number] Date: 01-10-24  RV Lead: [place] Reliance 4-Front Model: [phone] Serial: [id number] Date: 01-10-24  LV Lead: [place] Acuity X4 Straight Model: [phone] Serial: [id number] Date: 01-10-24  #. Complete heart block:  - In clinic device interrogation was performed, appropriate device function and stable lead parameters. - Continue remote monitoring.  #. Chronic systolic heart failure: Relatively well compensated currently.  Echocardiogram in January of this year showed LVEF of 36%. #. Ischemic cardiomyopathy: -Continue GDMT regimen of empagliflozin 10 mg once daily, Entresto 24-26 mg twice daily, metoprolol  XL 25 mg once daily, spironolactone  25 mg once daily.  He also takes torsemide 10mg  once daily. - He is only visiting currently.  He is eturning to [facility]  where he receives close cardiac care until he relocates to [place] in June of this year.  We will arrange for him to see one of my heart failure colleague, Dr. Alease Amend, to establish care in July. I will communicate with him to ensure follow up.  #. Paroxysmal atrial fibrillation:  #. Secondary hypercoagulable state due to atrial fibrillation: #. High risk medication use: Amiodarone. LFTs normal in 03/2024. TSH normal in 03/2024.  - Continue amiodarone 200mg  daily. - Continue Eliquis  5 mg twice daily. - Monitor burden with his CRT-D.  Follow up with Dr. Daneil Dunker in 6 months  Signed, Ardeen Kohler, MD

## 2024-06-10 ENCOUNTER — Telehealth (HOSPITAL_COMMUNITY): Payer: Self-pay

## 2024-06-10 NOTE — Telephone Encounter (Signed)
Left message for patient to call office and reschedule appointment.

## 2024-06-11 ENCOUNTER — Encounter (HOSPITAL_COMMUNITY): Admitting: Cardiology

## 2024-06-11 ENCOUNTER — Ambulatory Visit: Admitting: Family Medicine

## 2024-06-18 ENCOUNTER — Ambulatory Visit (HOSPITAL_COMMUNITY)
Admission: EM | Admit: 2024-06-18 | Discharge: 2024-06-18 | Disposition: A | Discharge status: Home / Self Care | Attending: Family Medicine | Admitting: Family Medicine

## 2024-06-18 ENCOUNTER — Encounter (HOSPITAL_COMMUNITY): Payer: Self-pay

## 2024-06-18 ENCOUNTER — Ambulatory Visit: Payer: Self-pay

## 2024-06-18 DIAGNOSIS — N390 Urinary tract infection, site not specified: Secondary | ICD-10-CM | POA: Diagnosis not present

## 2024-06-18 LAB — POCT URINALYSIS DIP (MANUAL ENTRY)
Bilirubin, UA: NEGATIVE
Glucose, UA: NEGATIVE mg/dL
Ketones, POC UA: NEGATIVE mg/dL
Nitrite, UA: NEGATIVE
Spec Grav, UA: 1.01 (ref 1.010–1.025)
Urobilinogen, UA: 0.2 U/dL
pH, UA: 5.5 (ref 5.0–8.0)

## 2024-06-18 MED ORDER — SULFAMETHOXAZOLE-TRIMETHOPRIM 800-160 MG PO TABS
1.0000 | ORAL_TABLET | Freq: Two times a day (BID) | ORAL | 0 refills | Status: AC
Start: 2024-06-18 — End: 2024-06-25

## 2024-06-18 NOTE — Telephone Encounter (Signed)
 FYI Only or Action Required?: FYI only for provider.  Patient was last seen in primary care on NP appointment next week. Called Nurse Triage reporting Dysuria. Symptoms began several days ago. Interventions attempted: OTC medications: Tylenol . Symptoms are: gradually worsening.  Triage Disposition: See Physician Within 24 Hours  Patient/caregiver understands and will follow disposition?: Yes                             Copied from CRM [id number]. Topic: Clinical - Red Word Triage >> Jun 18, 2024  9:49 AM Adelita E wrote: [facility] that prompted transfer to Nurse Triage: Possible UTI. Patient stated he is having difficulty urinating, painful urination, and blood in urine. Going on for 4 days. Reason for Disposition  Blood in urine (red, pink, or tea-colored)  Answer Assessment - Initial Assessment Questions 1. SEVERITY: How bad is the pain?  (e.g., Scale 1-10; mild, moderate, or severe)   - MILD (1-3): Complains slightly about urination hurting.   - MODERATE (4-7): Interferes with normal activities.     - SEVERE (8-10): Excruciating, unwilling or unable to urinate because of the pain.      Rates pain a 3 at this time  2. FREQUENCY: How many times have you had painful urination today?      Yes 3. PATTERN: Is pain present every time you urinate or just sometimes?      Every time  4. ONSET: When did the painful urination start?      A few days, worsening  5. FEVER: Do you have a fever? If Yes, ask: What is your temperature, how was it measured, and when did it start?     Denies 7. CAUSE: What do you think is causing the painful urination?      UTI 8. OTHER SYMPTOMS: Do you have any other symptoms? (e.g., flank pain, penis discharge, scrotal pain, blood in urine)     Hematuria  Protocols used: Urination Pain - Male-A-AH

## 2024-06-18 NOTE — ED Triage Notes (Signed)
 Pt c/o pain on urination with blood and incontinence at times x4 days.

## 2024-06-18 NOTE — Discharge Instructions (Addendum)
 Urinalysis done today. This shows Brent Arnold blood cells and blood. The symptoms and urinalysis are consistent with a urinary tract infection: We will treat with the following:  Bactrim  DS (sulfamethoxazole /trimethoprim )  800/160 mg twice daily for 7 days. This is an antibiotic. Take this around meal time.  Stay hydrated and drink plenty of water.  We have sent your urine for a culture and if this shows that we need to change your treatment, We will contact you. If you developed complete inability to urinate or if you symptoms worsen, then go to the emergency room for further evaluation.

## 2024-06-18 NOTE — ED Provider Notes (Signed)
 [facility]    CSN: [id number] Arrival date & time: 06/18/24  1314      History   Chief Complaint Chief Complaint  Patient presents with   Urinary Tract Infection    HPI Brent Arnold is a 83 y.o. male.   83 year old male who presents to urgent care with complaints of dysuria, frequency, urgency, difficulty urinating and increase in chronic mild incontinence. This has been going on for about 4 days. He reports that he will sit to try and urinate and can't get much out then will dribble into his pad. He can urinate better with standing.  He reports he has had almost 2 years of mild urinary incontinence and was seeing urology in [place] .  He takes Flomax  and finasteride  due to BPH and is set up to see a primary care next week and is hoping to get a referral to urology.  They just moved here from [place]  last week.  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  He denies abdominal pain, back pain, fever, nausea, vomiting.  He is eating and drinking normal.  He did have some chills on Saturday but that has resolved.  He also noticed some blood in his urine on Sunday.     Urinary Tract Infection Presenting symptoms: dysuria   Associated symptoms: urinary frequency   Associated symptoms: no abdominal pain, no fever, no flank pain, no hematuria, no scrotal swelling and no vomiting     Past Medical History:  Diagnosis Date   Antiphospholipid antibody syndrome (HCC)    Arthritis    Asthma    Crohn disease (HCC)    DVT (deep venous thrombosis) (HCC)    Hypertension     Patient Active Problem List   Diagnosis Date Noted   Cellulitis of right leg 04/10/2023   Antiphospholipid antibody syndrome (HCC) 04/10/2023   Hypertension 04/10/2023   Crohn disease (HCC) 04/10/2023   Benign prostatic hyperplasia with lower urinary tract symptoms 03/31/2023   Hyperlipidemia, unspecified 03/31/2023   Morbid (severe) obesity due to  excess calories (HCC) 03/30/2023    Past Surgical History:  Procedure Laterality Date   BACK SURGERY     CARDIAC SURGERY     CORONARY ARTERY BYPASS GRAFT     20 01   HERNIA REPAIR         Home Medications    Prior to Admission medications   Medication Sig Start Date End Date Taking? Authorizing Provider  sulfamethoxazole -trimethoprim  (BACTRIM  DS) 800-160 MG tablet Take 1 tablet by mouth 2 (two) times daily for 7 days. 06/18/24 06/25/24 Yes Caylen Yardley A, PA-C  acetaminophen  (TYLENOL ) 650 MG CR tablet Take 650 mg by mouth at bedtime as needed for pain.    [provider]  amiodarone (PACERONE) 200 MG tablet Take 1 tablet by mouth daily. 01/24/24 01/23/25  [provider]  apixaban  (ELIQUIS ) 5 MG TABS tablet Take 5 mg by mouth 2 (two) times daily.    [provider]  aspirin 81 MG chewable tablet Chew 1 tablet by mouth daily. 01/24/24 01/23/25  [provider]  atorvastatin  (LIPITOR) 80 MG tablet Take 1 tablet by mouth daily. 01/25/24 01/24/25  [provider]  BREO ELLIPTA 200-25 MCG/ACT AEPB Inhale 1 puff into the lungs daily.    [provider]  budesonide  (ENTOCORT EC ) 3 MG 24 hr capsule Take 3 mg by mouth in the morning and at bedtime. 10/31/19 07/02/24  [provider]  calcium  carbonate (OSCAL) 1500 (600 Ca) MG TABS tablet Take 600 mg by mouth daily with breakfast. 01/31/24 07/29/24  [provider]  celecoxib (CELEBREX) 100 MG capsule Take 100 mg by mouth at bedtime. 02/21/23   [provider]  Cholecalciferol 100 MCG (4000 UT) TABS Take 4,000 Units by mouth daily. 01/31/24 07/29/24  [provider]  cyanocobalamin (VITAMIN B12) 1000 MCG/ML injection Inject 1,000 mcg into the muscle once a week. 02/22/24 08/21/24  [provider]  empagliflozin (JARDIANCE) 10 MG TABS tablet Take 1 tablet by mouth daily. 01/24/24 01/23/25  [provider]  ENTRESTO 24-26 MG Take 1 tablet by mouth 2 (two)  times daily. 01/24/24   [provider]  esomeprazole (NEXIUM) 40 MG capsule Take 40 mg by mouth at bedtime.    [provider]  ferrous sulfate 325 (65 FE) MG tablet Take 1 tablet by mouth daily with breakfast. 10/27/23 04/24/24  [provider]  finasteride  (PROSCAR ) 5 MG tablet Take 1 tablet by mouth at bedtime. 12/28/23 08/15/24  [provider]  hydrocortisone cream 0.5 % Apply 1 Application topically as needed for itching. 12/29/23   [provider]  ipratropium-albuterol (DUONEB) 0.5-2.5 (3) MG/3ML SOLN Inhale 3 mLs into the lungs every 4 (four) hours as needed (for wheezing and shortness of breath). 03/13/23 04/12/23  [provider]  magnesium oxide (MAG-OX) 400 MG tablet Take 400 mg by mouth 2 (two) times daily. 01/31/24 07/29/24  [provider]  metFORMIN (GLUCOPHAGE-XR) 500 MG 24 hr tablet Take 500 mg by mouth daily with breakfast. 11/22/23 05/20/24  [provider]  metoprolol  succinate (TOPROL -XL) 25 MG 24 hr tablet Take 1 tablet by mouth at bedtime. 03/21/24 03/21/25  [provider]  montelukast  (SINGULAIR ) 10 MG tablet Take 10 mg by mouth daily.    [provider]  nitroGLYCERIN (NITROSTAT) 0.4 MG SL tablet Place 0.4 mg under the tongue every 5 (five) minutes as needed for chest pain. 12/09/21   [provider]  ondansetron  (ZOFRAN ) 4 MG tablet Take 1 tablet (4 mg total) by mouth every 6 (six) hours as needed for nausea or vomiting. 04/15/23   Rai, Nydia POUR, MD  spironolactone  (ALDACTONE ) 25 MG tablet Take 25 mg by mouth daily.    [provider]  tamsulosin  (FLOMAX ) 0.4 MG CAPS capsule Take 0.4 mg by mouth daily. 07/25/23   [provider]  testosterone cypionate (DEPOTESTOSTERONE CYPIONATE) 200 MG/ML injection Inject 100 mg into the muscle once a week. Wednesday 08/24/22   [provider]  torsemide (DEMADEX) 10 MG tablet Take 1 tablet by mouth daily. 04/02/24 04/02/25   [provider]    Family History History reviewed. No pertinent family history.  Social History Social History   Tobacco Use   Smoking status: Never   Smokeless tobacco: Never  Substance Use Topics   Alcohol use: Never   Drug use: Never     Allergies   Dilantin [phenytoin] and Gabapentin   Review of Systems Review of Systems  Constitutional:  Negative for chills and fever.  HENT:  Negative for ear pain and sore throat.   Eyes:  Negative for pain and visual disturbance.  Respiratory:  Negative for cough and shortness of breath.   Cardiovascular:  Negative for chest pain and palpitations.  Gastrointestinal:  Negative for abdominal pain and vomiting.  Genitourinary:  Positive for difficulty urinating, dysuria, frequency and urgency. Negative for flank pain, hematuria, scrotal swelling and testicular pain.  Musculoskeletal:  Negative for arthralgias and back pain.  Skin:  Negative for color change and rash.  Neurological:  Negative for seizures and syncope.  All other systems reviewed and are negative.    Physical Exam Triage Vital Signs ED Triage Vitals [06/18/24 1339]  Encounter Vitals Group     BP 105/60     Girls Systolic BP Percentile      Girls Diastolic BP Percentile      Boys Systolic BP Percentile      Boys Diastolic BP Percentile      Pulse Rate 60     Resp 18     Temp 98.5 F (36.9 C)     Temp Source Oral     SpO2 95 %     Weight      Height      Head Circumference      Peak Flow      Pain Score 0     Pain Loc      Pain Education      Exclude from Growth Chart    No data found.  Updated Vital Signs BP 105/60 (BP Location: Left Arm)   Pulse 60   Temp 98.5 F (36.9 C) (Oral)   Resp 18   SpO2 95%   Visual Acuity Right Eye Distance:   Left Eye Distance:   Bilateral Distance:    Right Eye Near:   Left Eye Near:    Bilateral Near:     Physical Exam Vitals and nursing note reviewed.  Constitutional:      General: He is not  in acute distress.    Appearance: He is well-developed.     Comments: Using a walker to ambulate  HENT:     Head: Normocephalic and atraumatic.  Eyes:     Conjunctiva/sclera: Conjunctivae normal.  Cardiovascular:     Rate and Rhythm: Normal rate and regular rhythm.     Heart sounds: No murmur heard. Pulmonary:     Effort: Pulmonary effort is normal. No respiratory distress.     Breath sounds: Normal breath sounds.  Abdominal:     Palpations: Abdomen is soft.     Tenderness: There is no abdominal tenderness.  Musculoskeletal:        General: No swelling.     Cervical back: Neck supple.  Skin:    General: Skin is warm and dry.     Capillary Refill: Capillary refill takes less than 2 seconds.  Neurological:     Mental Status: He is alert.  Psychiatric:        Mood and Affect: Mood normal.      UC Treatments / Results  Labs (all labs ordered are listed, but only abnormal results are displayed) Labs Reviewed  POCT URINALYSIS DIP (MANUAL ENTRY) - Abnormal; Notable for the following components:      Result Value   Clarity, UA cloudy (*)    Blood, UA moderate (*)    Protein Ur, POC trace (*)    Leukocytes, UA Moderate (2+) (*)    All other components within normal limits  URINE CULTURE    EKG   Radiology No results found.  Procedures Procedures (including critical care time)  Medications Ordered in UC Medications - No data to display  Initial Impression / Assessment and Plan / UC Course  I have reviewed the triage vital signs and the nursing notes.  Pertinent labs & imaging results that were available during my care of the patient were reviewed by  me and considered in my medical decision making (see chart for details).     Lower urinary tract infectious disease   Urinalysis done today. This shows Uzair Godley blood cells and blood. The symptoms and urinalysis are consistent with a urinary tract infection. Reassuringly the patient is able to urinate. He is also  scheduled to see a PCP next week and they can arrange for him to see a urologist in this area. We will treat with the following:  Bactrim  DS (sulfamethoxazole /trimethoprim )  800/160 mg twice daily for 7 days. This is an antibiotic. Take this around meal time.  Stay hydrated and drink plenty of water.  We have sent your urine for a culture and if this shows that we need to change your treatment, We will contact you. If you developed complete inability to urinate or if you symptoms worsen, then go to the emergency room for further evaluation.    Final Clinical Impressions(s) / UC Diagnoses   Final diagnoses:  Lower urinary tract infectious disease     Discharge Instructions      Urinalysis done today. This shows Areg Bialas blood cells and blood. The symptoms and urinalysis are consistent with a urinary tract infection: We will treat with the following:  Bactrim  DS (sulfamethoxazole /trimethoprim )  800/160 mg twice daily for 7 days. This is an antibiotic. Take this around meal time.  Stay hydrated and drink plenty of water.  We have sent your urine for a culture and if this shows that we need to change your treatment, We will contact you. If you developed complete inability to urinate or if you symptoms worsen, then go to the emergency room for further evaluation.      ED Prescriptions     Medication Sig Dispense Auth. Provider   sulfamethoxazole -trimethoprim  (BACTRIM  DS) 800-160 MG tablet Take 1 tablet by mouth 2 (two) times daily for 7 days. 14 tablet Teresa Almarie LABOR, PA-C      PDMP not reviewed this encounter.   Teresa Almarie LABOR, [place] 06/18/24 1458

## 2024-06-20 LAB — URINE CULTURE: Culture: >=100000 — AB

## 2024-06-21 ENCOUNTER — Ambulatory Visit (HOSPITAL_COMMUNITY): Payer: Self-pay

## 2024-06-26 ENCOUNTER — Ambulatory Visit (INDEPENDENT_AMBULATORY_CARE_PROVIDER_SITE_OTHER): Admitting: Family Medicine

## 2024-06-26 VITALS — BP 93/55 | HR 60 | Temp 97.9°F | Resp 18 | Ht 70.0 in | Wt 201.5 lb

## 2024-06-26 DIAGNOSIS — N138 Other obstructive and reflux uropathy: Secondary | ICD-10-CM | POA: Diagnosis not present

## 2024-06-26 DIAGNOSIS — J4540 Moderate persistent asthma, uncomplicated: Secondary | ICD-10-CM | POA: Diagnosis not present

## 2024-06-26 DIAGNOSIS — Z95810 Presence of automatic (implantable) cardiac defibrillator: Secondary | ICD-10-CM

## 2024-06-26 DIAGNOSIS — K50019 Crohn's disease of small intestine with unspecified complications: Secondary | ICD-10-CM

## 2024-06-26 DIAGNOSIS — R269 Unspecified abnormalities of gait and mobility: Secondary | ICD-10-CM | POA: Diagnosis not present

## 2024-06-26 DIAGNOSIS — N401 Enlarged prostate with lower urinary tract symptoms: Secondary | ICD-10-CM | POA: Diagnosis not present

## 2024-06-26 DIAGNOSIS — R7989 Other specified abnormal findings of blood chemistry: Secondary | ICD-10-CM

## 2024-06-26 DIAGNOSIS — E1159 Type 2 diabetes mellitus with other circulatory complications: Secondary | ICD-10-CM

## 2024-06-26 DIAGNOSIS — I5042 Chronic combined systolic (congestive) and diastolic (congestive) heart failure: Secondary | ICD-10-CM

## 2024-06-26 DIAGNOSIS — I252 Old myocardial infarction: Secondary | ICD-10-CM

## 2024-06-26 DIAGNOSIS — Z7984 Long term (current) use of oral hypoglycemic drugs: Secondary | ICD-10-CM

## 2024-06-26 MED ORDER — TESTOSTERONE CYPIONATE 200 MG/ML IM SOLN: 100.0000 mg | INTRAMUSCULAR | 1 refills | Status: AC

## 2024-06-26 MED ORDER — TAMSULOSIN HCL 0.4 MG PO CAPS: 0.4000 mg | ORAL_CAPSULE | Freq: Two times a day (BID) | ORAL | 1 refills | Status: DC

## 2024-06-26 MED ORDER — CYANOCOBALAMIN 1000 MCG/ML IJ SOLN: 1000.0000 ug | INTRAMUSCULAR | 3 refills | Status: AC

## 2024-06-26 NOTE — Patient Instructions (Signed)
 Welcome to [facility]! It was a pleasure meeting you today.  As discussed, Please schedule a 1 month follow up visit today.  [facility] [address] [place], [place] [address] [phone]   You have a referral in for [facility].  Please call them at [phone] to set up your appointment if you have not been contacted by their office within 3-5 business days.    Dr. Rollin [address] [phone]   PLEASE NOTE:  If you had any LAB tests please let us  know if you have not heard back within a few days. You may see your results on MyChart before we have a chance to review them but we will give you a call once they are reviewed by us . If we ordered any REFERRALS today, please let us  know if you have not heard from their office within the next week.  Let us  know through MyChart if you are needing REFILLS, or have your pharmacy send us  the request. You can also use MyChart to communicate with me or any office staff.  Please try these tips to maintain a healthy lifestyle:  Eat most of your calories during the day when you are active. Eliminate processed foods including packaged sweets (pies, cakes, cookies), reduce intake of potatoes, white bread, white pasta, and white rice. Look for whole grain options, oat flour or almond flour.  Each meal should contain half fruits/vegetables, one quarter protein, and one quarter carbs (no bigger than a computer mouse).  Cut down on sweet beverages. This includes juice, soda, and sweet tea. Also watch fruit intake, though this is a healthier sweet option, it still contains natural sugar! Limit to 3 servings daily.  Drink at least 1 glass of water with each meal and aim for at least 8 glasses per day  Exercise at least 150 minutes every week.

## 2024-06-26 NOTE — Progress Notes (Unsigned)
 New Patient Office Visit  Subjective:  Patient ID: Brent Arnold, male    DOB: 25-May-1941  Age: 83 y.o. MRN: [id number]  CC:  Chief Complaint  Patient presents with   Establish Care    Initial visit to establish care with new pcp Just completed medication for UTI, want to make sure it is gone     HPI Brent Arnold presents for new pt from [place] .  Here w/wife Discussed the use of AI scribe software for clinical note transcription with the patient, who gave verbal consent to proceed.  History of Present Illness Brent Arnold is an 83 year old male with a history of heart attack and congestive heart failure who presents for follow-up and management of multiple chronic conditions. He is accompanied by his wife, Brent Arnold.  He experienced a major heart attack in January, resulting in significant loss of heart function and subsequent heart failure. He has a pacemaker defibrillator and is under cardiology care. Heart failure class 3-EF 36% in Jan  He has a history of antiphospholipid antibody syndrome, though recent tests have not confirmed this diagnosis. Previously on warfarin, he is now taking Eliquis . He has a history of blood clots and is on blood thinners due to hypercoagulability.  He has asthma, managed with Breo for maintenance. Asthma exacerbations can lead to pneumonia, previously resulting in extended hospitalizations.  He has Crohn's disease, causing frequent diarrhea.it is at ileum.  He has experienced gastrointestinal bleeding post-colonoscopy, managed by a GI specialist.Has seen Dr. Rollin here in past when hosp-would like to continue care w/him.  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  He has high blood pressure -low on meds.  Runs 90-110's/50-60's  diabetes, managed with metformin  and Jardiance.  He recently had a urinary tract infection and has urinary issues, possibly related to an  enlarged prostate. He is on finasteride  and tamsulosin , with an increased dose of tamsulosin  to 0.8 mg, which has improved his symptoms.  He has a history of seizures following a stroke and has undergone multiple surgeries, including lumbar spine surgery, cataract surgery, and umbilical hernia repair. He also has a history of a significant fall resulting in shoulder tendon tears, limiting arm mobility.  He reports a recent episode of L elbow swelling and discoloration, which has improved but left a nodule. He is concerned about potential complications from this.  He has experienced significant mobility issues following a hospitalization earlier this year, affecting his ability to walk. He has progressed from using a walker to a cane but still feels unsteady.  LowT-on testosterone  replacement long time      Current Outpatient Medications:    acetaminophen  (TYLENOL ) 650 MG CR tablet, Take 650 mg by mouth at bedtime as needed for pain., Disp: , Rfl:    amiodarone (PACERONE) 200 MG tablet, Take 1 tablet by mouth daily., Disp: , Rfl:    apixaban  (ELIQUIS ) 5 MG TABS tablet, Take 5 mg by mouth 2 (two) times daily., Disp: , Rfl:    aspirin 81 MG chewable tablet, Chew 1 tablet by mouth daily., Disp: , Rfl:    atorvastatin  (LIPITOR) 80 MG tablet, Take 1 tablet by mouth daily., Disp: , Rfl:    BREO ELLIPTA 200-25 MCG/ACT AEPB, Inhale 1 puff into the lungs daily., Disp: , Rfl:    budesonide  (ENTOCORT EC ) 3 MG 24 hr capsule, Take 3 mg by mouth in the morning and at bedtime.,  Disp: , Rfl:    calcium  carbonate (OSCAL) 1500 (600 Ca) MG TABS tablet, Take 600 mg by mouth daily with breakfast., Disp: , Rfl:    celecoxib (CELEBREX) 100 MG capsule, Take 100 mg by mouth at bedtime., Disp: , Rfl:    Cholecalciferol 100 MCG (4000 UT) TABS, Take 4,000 Units by mouth daily., Disp: , Rfl:    empagliflozin (JARDIANCE) 10 MG TABS tablet, Take 1 tablet by mouth daily., Disp: , Rfl:    ENTRESTO 24-26 MG, Take 1 tablet by  mouth 2 (two) times daily., Disp: , Rfl:    esomeprazole (NEXIUM) 40 MG capsule, Take 40 mg by mouth at bedtime., Disp: , Rfl:    ferrous sulfate 325 (65 FE) MG tablet, Take 1 tablet by mouth daily with breakfast., Disp: , Rfl:    finasteride  (PROSCAR ) 5 MG tablet, Take 1 tablet by mouth at bedtime., Disp: , Rfl:    hydrocortisone cream 0.5 %, Apply 1 Application topically as needed for itching., Disp: , Rfl:    ipratropium-albuterol (DUONEB) 0.5-2.5 (3) MG/3ML SOLN, Inhale 3 mLs into the lungs every 4 (four) hours as needed (for wheezing and shortness of breath)., Disp: , Rfl:    magnesium oxide (MAG-OX) 400 MG tablet, Take 400 mg by mouth 2 (two) times daily., Disp: , Rfl:    metFORMIN  (GLUCOPHAGE -XR) 500 MG 24 hr tablet, Take 500 mg by mouth daily with breakfast., Disp: , Rfl:    metoprolol  succinate (TOPROL -XL) 25 MG 24 hr tablet, Take 1 tablet by mouth at bedtime., Disp: , Rfl:    montelukast  (SINGULAIR ) 10 MG tablet, Take 10 mg by mouth daily., Disp: , Rfl:    nitroGLYCERIN (NITROSTAT) 0.4 MG SL tablet, Place 0.4 mg under the tongue every 5 (five) minutes as needed for chest pain., Disp: , Rfl:    ondansetron  (ZOFRAN ) 4 MG tablet, Take 1 tablet (4 mg total) by mouth every 6 (six) hours as needed for nausea or vomiting., Disp: 20 tablet, Rfl: 0   spironolactone  (ALDACTONE ) 25 MG tablet, Take 25 mg by mouth daily., Disp: , Rfl:    torsemide (DEMADEX) 10 MG tablet, Take 1 tablet by mouth daily., Disp: , Rfl:    cyanocobalamin  (VITAMIN B12) 1000 MCG/ML injection, Inject 1 mL (1,000 mcg total) into the muscle once a week., Disp: 12 mL, Rfl: 3   tamsulosin  (FLOMAX ) 0.4 MG CAPS capsule, Take 1 capsule (0.4 mg total) by mouth in the morning and at bedtime., Disp: 180 capsule, Rfl: 1   testosterone  cypionate (DEPOTESTOSTERONE CYPIONATE) 200 MG/ML injection, Inject 0.5 mLs (100 mg total) into the muscle once a week. Wednesday, Disp: 6 mL, Rfl: 1  Past Medical History:  Diagnosis Date   A-fib (HCC)     Allergy    [place]   Antiphospholipid antibody syndrome (HCC)    Arthritis    Asthma    Cataract 2014   Removed   CHF (congestive heart failure), NYHA class III, chronic, combined (HCC)    Crohn disease (HCC)    DVT (deep venous thrombosis) (HCC)    GERD (gastroesophageal reflux disease) 2022   Hypertension    Myocardial infarction (HCC) 2002   Seizures (HCC) 1092   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   Stroke (HCC) 1992    Past Surgical History:  Procedure Laterality Date   BACK SURGERY     CARDIAC SURGERY     CORONARY ARTERY BYPASS GRAFT  2001   2001   EYE SURGERY  2014   Caterax replacement  HERNIA REPAIR  2015   umbilical   PACEMAKER INSERTION     SPINE SURGERY  Feb 03, 2023   Releaved tendion on nerve lumbar   TONSILLECTOMY      Family History  Problem Relation Age of Onset   Arthritis Mother    Heart disease Mother 28   Cancer Father 31       colon   Diabetes Sister     Social History   Socioeconomic History   Marital status: Married    Spouse name: Not on file   Number of children: 7   Years of education: Not on file   Highest education level: Doctorate  Occupational History   Not on file  Tobacco Use   Smoking status: Never   Smokeless tobacco: Never   Tobacco comments:    Never used it  Advertising account planner   Vaping status: Never Used  Substance and Sexual Activity   Alcohol use: Never   Drug use: Never   Sexual activity: Yes    Birth control/protection: Other-see comments    Comment: Old age  Other Topics Concern   Not on file  Social History Narrative   [address] 22   [address]   Retired Public affairs consultant-   Social Drivers of Corporate investment banker Strain: Low Risk  (06/26/2024)   Overall Financial Resource Strain (CARDIA)    Difficulty of Paying Living Expenses: Not hard at all  Food Insecurity: No Food Insecurity (06/26/2024)   Hunger Vital Sign    Worried About Running Out of Food in the Last Year: Never true    Ran Out of Food in the Last  Year: Never true  Transportation Needs: No Transportation Needs (06/26/2024)   PRAPARE - Administrator, Civil Service (Medical): No    Lack of Transportation (Non-Medical): No  Physical Activity: Unknown (06/26/2024)   Exercise Vital Sign    Days of Exercise per Week: Patient declined    Minutes of Exercise per Session: Not on file  Stress: No Stress Concern Present (06/26/2024)   [employer] of Occupational Health - Occupational Stress Questionnaire    Feeling of Stress: Not at all  Social Connections: Socially Integrated (06/26/2024)   Social Connection and Isolation Panel    Frequency of Communication with Friends and Family: More than three times a week    Frequency of Social Gatherings with Friends and Family: Three times a week    Attends Religious Services: More than 4 times per year    Active Member of Clubs or Organizations: Yes    Attends Banker Meetings: More than 4 times per year    Marital Status: Married  Catering manager Violence: Not At Risk (04/11/2023)   Humiliation, Afraid, Rape, and Kick questionnaire    Fear of Current or Ex-Partner: No    Emotionally Abused: No    Physically Abused: No    Sexually Abused: No    ROS  ROS: Gen: no fever, chills  Skin: no rash, itching Resp: no cough, wheeze,SOB CV: no CP, palpitations.  +LE edema-wears compression stockings,  MSK: chronic Neuro: no dizziness, headache, weakness, vertigo Psych: no depression, anxiety, insomnia, SI   Objective:   Today's Vitals: BP (!) 93/55   Pulse 60   Temp 97.9 F (36.6 C) (Temporal)   Resp 18   Ht 5' 10 (1.778 m)   Wt 201 lb 8 oz (91.4 kg)   SpO2 94%   BMI 28.91 kg/m   Physical  Exam  Gen: WDWN NAD HEENT: NCAT, conjunctiva not injected, sclera nonicteric NECK:  supple, no thyromegaly, no nodes, no carotid bruits CARDIAC: RRR, S1S2+, +2-3/6 murmur. DP 1+B LUNGS: CTAB. No wheezes EXT: 1+ edema R>L.  Wearing compression stockings MSK: cane. L  elbow-residual sack from olecranon bursitis NEURO: A&O x3.  CN II-XII intact.  PSYCH: normal mood. Good eye contact   Reviewed UC notes and labs from [place]  in June  Assessment & Plan:  Benign prostatic hyperplasia with urinary obstruction -     Ambulatory referral to Urology  Moderate persistent asthma without complication -     Pulmonary Visit  Crohn's disease of small intestine with complication (HCC) -     Ambulatory referral to Gastroenterology  Gait disturbance -     Ambulatory referral to Physical Therapy  History of MI (myocardial infarction)  Type 2 diabetes mellitus with other circulatory complication, without long-term current use of insulin (HCC)  Chronic combined systolic and diastolic congestive heart failure (HCC)  Low testosterone  in male  Presence of combination internal cardiac defibrillator (ICD) and pacemaker  Long term current use of oral hypoglycemic drug  Other orders -     Tamsulosin  HCl; Take 1 capsule (0.4 mg total) by mouth in the morning and at bedtime.  Dispense: 180 capsule; Refill: 1 -     Cyanocobalamin ; Inject 1 mL (1,000 mcg total) into the muscle once a week.  Dispense: 12 mL; Refill: 3 -     Testosterone  Cypionate; Inject 0.5 mLs (100 mg total) into the muscle once a week. Wednesday  Dispense: 6 mL; Refill: 1  Assessment and Plan Assessment & Plan Congestive Heart Failure (CHF)   CHF class III presents with significant physical activity limitations following a myocardial infarction in January 2025, resulting in a loss of about one-third of cardiac function. He is managed with a pacemaker-defibrillator and under cardiology care, with a  hospitalization for cardiac issues complicated by pneumonia in Jan. A follow-up with Dr. Rolan in cardiology is scheduled. Continue follow-up with the cardiologist at [facility] and maintain current medications, including amiodarone and Jardiance.  : Toprol  25 mg daily, Entresto 24/26 BID, Jardiance 10 mg  daily, Aldactone  25 mg daily   torsemide prn.   Benign Prostatic Hyperplasia (BPH)   A recent UTI, likely secondary to urinary retention due to BPH, has improved with an increased tamsulosin  dosage to 0.8 mg. No current urinary symptoms post-UTI treatment. He is aware of the need to monitor for potential recurrence of urinary symptoms. Continue tamsulosin  0.8 mg and refer to a urologist for further evaluation and management.  Asthma   Asthma is well-managed on Breo for maintenance, with infrequent use of a rescue inhaler or nebulizer. Potential exacerbations have lead to pneumonia. He is aware of potential increased pollen exposure in the new location. Refer to a pulmonologist for ongoing management.  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 Rollin. He prefers to return to Dr. Rollin for management. Refer to a gastroenterologist, preferably Dr. Rollin, and prescribe B12 injections, 1000 mcg IM weekly.  Diabetes Mellitus   Diabetes has progressed from prediabetes, managed with metformin  and Jardiance, with a recent HbA1c improved to 5.6%. Continue the current diabetes management regimen.  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 Recent elbow bursitis with a residual nodule is present, and he  is advised against aggressive massage to prevent hematoma. He is aware of the potential for future orthopedic evaluation if symptoms worsen. Consider referral to an orthopedic specialist if symptoms worsen.  Rehabilitation Needs   Deconditioning and impaired ambulation due to a history of falls and prolonged hospitalization require attention. He currently uses a cane and requires re-coordination of walking skills, desiring improved mobility and confidence in walking. Refer to physical therapy for gait training and  rehabilitation.  General Health Maintenance   No recent screenings or vaccinations were discussed.  Follow-up   He has recently relocated and is establishing care, requiring multiple specialist referrals for comprehensive management of chronic conditions. Ensure referrals to cardiology, urology, pulmonology, gastroenterology, and physical therapy are completed. Schedule a follow-up appointment to review progress and management plan. Complex pt    Follow-up: Return in about 4 weeks (around 07/24/2024) for chronic follow-up.   Brent CHRISTELLA Carrel, MD

## 2024-06-27 DIAGNOSIS — I252 Old myocardial infarction: Secondary | ICD-10-CM | POA: Insufficient documentation

## 2024-06-27 DIAGNOSIS — Z95810 Presence of automatic (implantable) cardiac defibrillator: Secondary | ICD-10-CM | POA: Insufficient documentation

## 2024-06-27 DIAGNOSIS — R269 Unspecified abnormalities of gait and mobility: Secondary | ICD-10-CM | POA: Insufficient documentation

## 2024-06-27 DIAGNOSIS — E119 Type 2 diabetes mellitus without complications: Secondary | ICD-10-CM | POA: Insufficient documentation

## 2024-06-27 DIAGNOSIS — R7989 Other specified abnormal findings of blood chemistry: Secondary | ICD-10-CM | POA: Insufficient documentation

## 2024-06-27 DIAGNOSIS — I5042 Chronic combined systolic (congestive) and diastolic (congestive) heart failure: Secondary | ICD-10-CM | POA: Insufficient documentation

## 2024-06-28 ENCOUNTER — Other Ambulatory Visit: Payer: Self-pay | Admitting: Family Medicine

## 2024-06-28 MED ORDER — BUDESONIDE 3 MG PO CPEP: 3.0000 mg | ORAL_CAPSULE | Freq: Two times a day (BID) | ORAL | 0 refills | Status: DC

## 2024-06-28 MED ORDER — METFORMIN HCL ER 500 MG PO TB24: 500.0000 mg | ORAL_TABLET | Freq: Every day | ORAL | 1 refills | Status: AC

## 2024-06-28 MED ORDER — ATORVASTATIN CALCIUM 80 MG PO TABS: 80.0000 mg | ORAL_TABLET | Freq: Every day | ORAL | 3 refills | Status: AC

## 2024-06-29 ENCOUNTER — Other Ambulatory Visit (HOSPITAL_BASED_OUTPATIENT_CLINIC_OR_DEPARTMENT_OTHER): Payer: Self-pay

## 2024-06-29 ENCOUNTER — Ambulatory Visit (HOSPITAL_COMMUNITY)
Admission: RE | Admit: 2024-06-29 | Discharge: 2024-06-29 | Disposition: A | Discharge status: Home / Self Care | Source: Ambulatory Visit | Attending: Cardiology | Admitting: Cardiology

## 2024-06-29 ENCOUNTER — Encounter (HOSPITAL_COMMUNITY): Payer: Self-pay | Admitting: Cardiology

## 2024-06-29 ENCOUNTER — Ambulatory Visit (HOSPITAL_COMMUNITY): Payer: Self-pay | Admitting: Cardiology

## 2024-06-29 VITALS — BP 130/62 | HR 62 | Ht 70.0 in | Wt 201.6 lb

## 2024-06-29 DIAGNOSIS — R5383 Other fatigue: Secondary | ICD-10-CM | POA: Diagnosis not present

## 2024-06-29 DIAGNOSIS — Z7901 Long term (current) use of anticoagulants: Secondary | ICD-10-CM | POA: Diagnosis not present

## 2024-06-29 DIAGNOSIS — I5042 Chronic combined systolic (congestive) and diastolic (congestive) heart failure: Secondary | ICD-10-CM

## 2024-06-29 DIAGNOSIS — I442 Atrioventricular block, complete: Secondary | ICD-10-CM | POA: Diagnosis not present

## 2024-06-29 DIAGNOSIS — Z7982 Long term (current) use of aspirin: Secondary | ICD-10-CM | POA: Diagnosis not present

## 2024-06-29 DIAGNOSIS — I2510 Atherosclerotic heart disease of native coronary artery without angina pectoris: Secondary | ICD-10-CM | POA: Diagnosis not present

## 2024-06-29 DIAGNOSIS — Z8673 Personal history of transient ischemic attack (TIA), and cerebral infarction without residual deficits: Secondary | ICD-10-CM | POA: Insufficient documentation

## 2024-06-29 DIAGNOSIS — R9431 Abnormal electrocardiogram [ECG] [EKG]: Secondary | ICD-10-CM | POA: Insufficient documentation

## 2024-06-29 DIAGNOSIS — D649 Anemia, unspecified: Secondary | ICD-10-CM | POA: Diagnosis not present

## 2024-06-29 DIAGNOSIS — R531 Weakness: Secondary | ICD-10-CM | POA: Diagnosis not present

## 2024-06-29 DIAGNOSIS — Z4502 Encounter for adjustment and management of automatic implantable cardiac defibrillator: Secondary | ICD-10-CM | POA: Insufficient documentation

## 2024-06-29 DIAGNOSIS — I255 Ischemic cardiomyopathy: Secondary | ICD-10-CM | POA: Diagnosis not present

## 2024-06-29 DIAGNOSIS — Z79899 Other long term (current) drug therapy: Secondary | ICD-10-CM | POA: Insufficient documentation

## 2024-06-29 DIAGNOSIS — I959 Hypotension, unspecified: Secondary | ICD-10-CM | POA: Insufficient documentation

## 2024-06-29 DIAGNOSIS — Z951 Presence of aortocoronary bypass graft: Secondary | ICD-10-CM | POA: Insufficient documentation

## 2024-06-29 DIAGNOSIS — I480 Paroxysmal atrial fibrillation: Secondary | ICD-10-CM | POA: Diagnosis not present

## 2024-06-29 DIAGNOSIS — Z86718 Personal history of other venous thrombosis and embolism: Secondary | ICD-10-CM | POA: Diagnosis not present

## 2024-06-29 DIAGNOSIS — I5022 Chronic systolic (congestive) heart failure: Secondary | ICD-10-CM | POA: Diagnosis not present

## 2024-06-29 DIAGNOSIS — M545 Low back pain, unspecified: Secondary | ICD-10-CM | POA: Diagnosis not present

## 2024-06-29 LAB — COMPREHENSIVE METABOLIC PANEL WITH GFR
ALT: 28 U/L (ref 0–44)
AST: 26 U/L (ref 15–41)
Albumin: 3.5 g/dL (ref 3.5–5.0)
Alkaline Phosphatase: 49 U/L (ref 38–126)
Anion gap: 9 (ref 5–15)
BUN: 39 mg/dL — ABNORMAL HIGH (ref 8–23)
CO2: 23 mmol/L (ref 22–32)
Calcium: 9.3 mg/dL (ref 8.9–10.3)
Chloride: 103 mmol/L (ref 98–111)
Creatinine, Ser: 1.12 mg/dL (ref 0.61–1.24)
GFR, Estimated: >60 mL/min (ref >60)
Glucose, Bld: 100 mg/dL — ABNORMAL HIGH (ref 70–99)
Potassium: 4.8 mmol/L (ref 3.5–5.1)
Sodium: 135 mmol/L (ref 135–145)
Total Bilirubin: 0.8 mg/dL (ref 0.0–1.2)
Total Protein: 7.2 g/dL (ref 6.5–8.1)

## 2024-06-29 LAB — LIPID PANEL
Cholesterol: 115 mg/dL (ref 0–200)
HDL: 32 mg/dL — ABNORMAL LOW (ref >40)
LDL Cholesterol: 65 mg/dL (ref 0–99)
Total CHOL/HDL Ratio: 3.6 ratio
Triglycerides: 92 mg/dL (ref <150)
VLDL: 18 mg/dL (ref 0–40)

## 2024-06-29 LAB — IRON AND TIBC
Iron: 110 ug/dL (ref 45–182)
Saturation Ratios: 31 % (ref 17.9–39.5)
TIBC: 358 ug/dL (ref 250–450)
UIBC: 248 ug/dL

## 2024-06-29 LAB — CBC
HCT: 56.1 % — ABNORMAL HIGH (ref 39.0–52.0)
Hemoglobin: 18.1 g/dL — ABNORMAL HIGH (ref 13.0–17.0)
MCH: 30.1 pg (ref 26.0–34.0)
MCHC: 32.3 g/dL (ref 30.0–36.0)
MCV: 93.3 fL (ref 80.0–100.0)
Platelets: 245 K/uL (ref 150–400)
RBC: 6.01 MIL/uL — ABNORMAL HIGH (ref 4.22–5.81)
RDW: 16.3 % — ABNORMAL HIGH (ref 11.5–15.5)
WBC: 13.3 K/uL — ABNORMAL HIGH (ref 4.0–10.5)
nRBC: 0 % (ref 0.0–0.2)

## 2024-06-29 LAB — FERRITIN: Ferritin: 29 ng/mL (ref 24–336)

## 2024-06-29 LAB — TSH: TSH: 1.079 u[IU]/mL (ref 0.350–4.500)

## 2024-06-29 LAB — BRAIN NATRIURETIC PEPTIDE: B Natriuretic Peptide: 239.8 pg/mL — ABNORMAL HIGH (ref 0.0–100.0)

## 2024-06-29 MED ORDER — APIXABAN 5 MG PO TABS
5.0000 mg | ORAL_TABLET | Freq: Two times a day (BID) | ORAL | 11 refills | Status: AC
  Filled 2024-06-29: qty 60, 30d supply, fill #0
  Filled 2024-07-28: qty 60, 30d supply, fill #1
  Filled 2024-08-22: qty 60, 30d supply, fill #2
  Filled 2024-09-29: qty 60, 30d supply, fill #3
  Filled 2024-10-22: qty 60, 30d supply, fill #4
  Filled 2024-11-21: qty 60, 30d supply, fill #5
  Filled 2024-12-26: qty 60, 30d supply, fill #6

## 2024-06-29 MED ORDER — SPIRONOLACTONE 25 MG PO TABS: 25.0000 mg | ORAL_TABLET | Freq: Every evening | ORAL | Status: AC

## 2024-06-29 MED ORDER — EMPAGLIFLOZIN 10 MG PO TABS
10.0000 mg | ORAL_TABLET | Freq: Every day | ORAL | 11 refills | Status: AC
  Filled 2024-06-29: qty 30, 30d supply, fill #0
  Filled 2024-07-30: qty 30, 30d supply, fill #1
  Filled 2024-08-22: qty 90, 90d supply, fill #2
  Filled 2024-08-24: qty 30, 30d supply, fill #2
  Filled 2024-09-21: qty 90, 90d supply, fill #3
  Filled 2024-12-26: qty 90, 90d supply, fill #4

## 2024-06-29 MED ORDER — METOPROLOL SUCCINATE ER 50 MG PO TB24
50.0000 mg | ORAL_TABLET | Freq: Every day | ORAL | 3 refills | Status: AC
  Filled 2024-06-29 – 2025-01-16 (×2): qty 90, 90d supply, fill #0

## 2024-06-29 NOTE — Progress Notes (Signed)
 ReDS Vest / Clip - 06/29/24 1537       ReDS Vest / Clip   Station Marker D    Ruler Value 41    ReDS Value Range Low volume    ReDS Actual Value 21

## 2024-06-29 NOTE — Patient Instructions (Signed)
 CHANGE Spironolactone  to nightly.  INCREASE Metoprolol  to 50 mg nightly.  Labs done today, your results will be available in MyChart, we will contact you for abnormal readings.  Your physician has requested that you have an echocardiogram. Echocardiography is a painless test that uses sound waves to create images of your heart. It provides your doctor with information about the size and shape of your heart and how well your heart's chambers and valves are working. This procedure takes approximately one hour. There are no restrictions for this procedure. Please do NOT wear cologne, perfume, aftershave, or lotions (deodorant is allowed). Please arrive 15 minutes prior to your appointment time.  Please note: We ask at that you not bring children with you during ultrasound (echo/ vascular) testing. Due to room size and safety concerns, children are not allowed in the ultrasound rooms during exams. Our front office staff cannot provide observation of children in our lobby area while testing is being conducted. An adult accompanying a patient to their appointment will only be allowed in the ultrasound room at the discretion of the ultrasound technician under special circumstances. We apologize for any inconvenience.  Your physician recommends that you schedule a follow-up appointment in: 2 months.  If you have any questions or concerns before your next appointment please send us  a message through [place] or call our office at [phone].    TO LEAVE A MESSAGE FOR THE NURSE SELECT OPTION 2, PLEASE LEAVE A MESSAGE INCLUDING: YOUR NAME DATE OF BIRTH CALL BACK NUMBER REASON FOR CALL**this is important as we prioritize the call backs  YOU WILL RECEIVE A CALL BACK THE SAME DAY AS LONG AS YOU CALL BEFORE 4:00 PM  At the [facility], you and your health needs are our priority. As part of our continuing mission to provide you with exceptional heart care, we have created designated  Provider Care Teams. These Care Teams include your primary Cardiologist (physician) and Advanced Practice Providers (APPs- Physician Assistants and Nurse Practitioners) who all work together to provide you with the care you need, when you need it.   You may see any of the following providers on your designated Care Team at your next follow up: Dr Toribio Fuel Dr Ezra Shuck Dr. Ria Commander Dr. Morene Brownie Amy Lenetta, NP Caffie Shed, [place] [facility] [place], [place] Beckey Coe, NP [place] Lee, NP Ellouise Class, NP Tinnie Redman, PharmD Jaun Bash, PharmD   Please be sure to bring in all your medications bottles to every appointment.    Thank you for choosing [facility]

## 2024-07-01 NOTE — Progress Notes (Addendum)
 PCP: Wendolyn Jenkins Jansky, MD HF Cardiology: Dr. Rolan  Chief complaint: CHF  83 y.o. with history of CAD s/p CABG, ischemic cardiomyopathy, CVA, DVT, and paroxysmal atrial fibrillation was referred by Dr. Wendolyn for evaluation of CHF and CAD. Patient had CABG in 2001 in [place]  with LIMA-LAD, free RIMA-dRCA, sequential SVG-D1 and OM3. Patient was admitted in 1/25 in [place]  with CHF.  Echo showed EF 36%, mild LVH, PASP 31 mmHg, aortic sclerosis without significant stenosis. LHC 1/25 showed patent LIMA-LAD, free RIMA-dRCA.  Sequential SVG-D1 and OM3 with occluded branch to OM3 and 50% stenosis of branch to D1. 99% LM, 90% heavily calcified proximal LCx.  Small LCx territory, decided against PCI LM-LCx given extensive disease and small territory supplied. Patient had episodes of complete heart block this hospitalization and [employer] CRT-D device was placed.  Followup echo in 4/25 showed EF 44%, normal RV.   Patient recently moved to [place] from [place]  to live near his daughter.  He has been stable symptomatically.  He has generalized fatigue.  BP often runs low, sometimes in the high 80s-90s systolic.  No lightheadedness or syncope.  No falls.  He walks with a cane for stability.  No dyspnea with usual activities. DOE if he walks fast or goes up stairs. He has low back pain and leg weakness; he has a history of back surgery.   REDS clip 21%  ECG (personally reviewed): A-BiV paced  [employer] device interrogation: 99% BiV pacing, 0% AF, HeartLogic 5  Labs (6/25): K 4.8, creatinine 1.08, LFTs normal  PMH: 1. CAD: S/p CABG 2001 with LIMA-LAD, free RIMA-dRCA, sequential SVG-D1 and OM3.  - LHC 1/25 showed patent LIMA-LAD, free RIMA-dRCA.  Sequential SVG-D1 and OM3 with occluded branch to OM3 and 50% stenosis of branch to D1. 99% LM, 90% heavily calcified proximal LCx.  Small LCx territory, decided against PCI LM-LCx given extensive disease and small territory supplied.  2. Chronic systolic  CHF: Ischemic cardiomyopathy.  [employer] CRT-D device.  - Echo (1/25): EF 36%, mild LVH, PASP 31 mmHg, aortic sclerosis without significant stenosis.   - Echo (4/25): EF 44%, normal RV.  3. CVA 4. 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  5. Atrial fibrillation: Paroxysmal 6. Asthma/bronchiectasis 7. Crohns disease 8. Benign pancreatic cyst 9. Complete heart block: Environmental manager CRT-D.   SH: Married, from [place] , moved to [place] to live near daughter.  Nonsmoker, no ETOH.   Family History  Problem Relation Age of Onset   Arthritis Mother    Heart disease Mother 54   Cancer Father 34       colon   Diabetes Sister    ROS: All systems reviewed and negative except as per HPI.   Current Outpatient Medications  Medication Sig Dispense Refill   acetaminophen  (TYLENOL ) 650 MG CR tablet Take 650 mg by mouth at bedtime as needed for pain.     amiodarone (PACERONE) 200 MG tablet Take 1 tablet by mouth daily.     aspirin 81 MG chewable tablet Chew 1 tablet by mouth daily.     atorvastatin  (LIPITOR) 80 MG tablet Take 1 tablet (80 mg total) by mouth daily. 90 tablet 3   BREO ELLIPTA 200-25 MCG/ACT AEPB Inhale 1 puff into the lungs daily.     budesonide  (ENTOCORT EC ) 3 MG 24 hr capsule Take 1 capsule (3 mg total) by mouth in the morning and at bedtime. 180 capsule 0   calcium  carbonate (OSCAL) 1500 (600 Ca) MG TABS tablet Take 600 mg  by mouth daily with breakfast.     celecoxib (CELEBREX) 100 MG capsule Take 100 mg by mouth at bedtime.     Cholecalciferol 100 MCG (4000 UT) TABS Take 4,000 Units by mouth daily.     cyanocobalamin  (VITAMIN B12) 1000 MCG/ML injection Inject 1 mL (1,000 mcg total) into the muscle once a week. 12 mL 3   ENTRESTO 24-26 MG Take 1 tablet by mouth 2 (two) times daily.     esomeprazole (NEXIUM) 40 MG capsule Take 40 mg by mouth at bedtime.     ferrous sulfate 325 (65 FE) MG tablet Take 1 tablet by mouth daily with breakfast.     finasteride  (PROSCAR ) 5 MG tablet  Take 1 tablet by mouth at bedtime.     hydrocortisone cream 0.5 % Apply 1 Application topically as needed for itching.     ipratropium-albuterol (DUONEB) 0.5-2.5 (3) MG/3ML SOLN Inhale 3 mLs into the lungs every 4 (four) hours as needed (for wheezing and shortness of breath).     magnesium oxide (MAG-OX) 400 MG tablet Take 400 mg by mouth 2 (two) times daily.     metFORMIN  (GLUCOPHAGE -XR) 500 MG 24 hr tablet Take 1 tablet (500 mg total) by mouth daily with breakfast. 90 tablet 1   montelukast  (SINGULAIR ) 10 MG tablet Take 10 mg by mouth daily.     nitroGLYCERIN (NITROSTAT) 0.4 MG SL tablet Place 0.4 mg under the tongue every 5 (five) minutes as needed for chest pain.     ondansetron  (ZOFRAN ) 4 MG tablet Take 1 tablet (4 mg total) by mouth every 6 (six) hours as needed for nausea or vomiting. 20 tablet 0   tamsulosin  (FLOMAX ) 0.4 MG CAPS capsule Take 1 capsule (0.4 mg total) by mouth in the morning and at bedtime. 180 capsule 1   testosterone  cypionate (DEPOTESTOSTERONE CYPIONATE) 200 MG/ML injection Inject 0.5 mLs (100 mg total) into the muscle once a week. Wednesday 6 mL 1   torsemide (DEMADEX) 10 MG tablet Take 1 tablet by mouth daily.     apixaban  (ELIQUIS ) 5 MG TABS tablet Take 1 tablet (5 mg total) by mouth 2 (two) times daily. 60 tablet 11   empagliflozin  (JARDIANCE ) 10 MG TABS tablet Take 1 tablet (10 mg total) by mouth daily. 30 tablet 11   metoprolol  succinate (TOPROL -XL) 50 MG 24 hr tablet Take 1 tablet (50 mg total) by mouth at bedtime. 90 tablet 3   spironolactone  (ALDACTONE ) 25 MG tablet Take 1 tablet (25 mg total) by mouth at bedtime.     No current facility-administered medications for this encounter.   BP 130/62   Pulse 62   Ht 5' 10 (1.778 m)   Wt 91.4 kg (201 lb 9.6 oz)   SpO2 95%   BMI 28.93 kg/m  General: NAD Neck: No JVD, no thyromegaly or thyroid  nodule.  Lungs: Clear to auscultation bilaterally with normal respiratory effort. CV: Nondisplaced PMI.  Heart regular  S1/S2, no S3/S4, no murmur.  No peripheral edema.  No carotid bruit.  Normal pedal pulses.  Abdomen: Soft, nontender, no hepatosplenomegaly, no distention.  Skin: Intact without lesions or rashes.  Neurologic: Alert and oriented x 3.  Psych: Normal affect. Extremities: No clubbing or cyanosis.  HEENT: Normal.   Assessment/Plan: 1. Chronic systolic CHF: Ischemic cardiomyopathy.  Echo in 1/25 with EF 36%, mild LVH.  Echo in 4/25 with EF 44%.  He has a Environmental manager CRT-D device.  GDMT will be limited by low BP though he is not  symptomatic and BP is good in the office today.  He is not volume overloaded by exam, HeartLogic, or REDS clip.  - Continue Jardiance  10 mg daily.  - Move spironolactone  25 mg daily to at bedtime.  - Increase Toprol  XL to 50 mg daily and move to at bedtime.  - Continue Entresto 24/26 bid.  - Continue torsemide 10 mg daily, BMET/BNP today.  - I will arrange for echocardiogram.  2. CAD: S/p CABG 2001.  On cath in 1/25, he had unrevascularized disease in the LCx system, but he has had no recent chest pain, and the LCx territory is small and extensively diseased so decision made to medically manage.  No chest pain.  - Continue ASA 81 - Continue Atorvastatin  80 mg daily.  Check lipids today.  3. Atrial fibrillation: Paroxysmal. He is in NSR and denies palpitations.  - Continue amiodarone 200 mg daily to maintain NSR.  Check LFTs and TSH, he will need a regular eye exam.  - Continue apixaban .  CBC today 4. H/o DVT: Not triggered.   - Continue apixaban .  5. Anemia: Check Fe studies.  6. Complete heart block: Environmental manager CRT-D device.   Followup in 2 months.   I spent 56 minutes reviewing records, interviewing/examining patient, and managing orders.    Ezra Shuck 07/01/2024

## 2024-07-03 ENCOUNTER — Other Ambulatory Visit (HOSPITAL_BASED_OUTPATIENT_CLINIC_OR_DEPARTMENT_OTHER): Payer: Self-pay

## 2024-07-03 MED ORDER — EZETIMIBE 10 MG PO TABS
10.0000 mg | ORAL_TABLET | Freq: Every day | ORAL | 3 refills | Status: DC
  Filled 2024-07-03: qty 90, 90d supply, fill #0

## 2024-07-05 ENCOUNTER — Ambulatory Visit

## 2024-07-05 ENCOUNTER — Telehealth: Payer: Self-pay | Admitting: *Deleted

## 2024-07-05 DIAGNOSIS — I255 Ischemic cardiomyopathy: Secondary | ICD-10-CM

## 2024-07-05 LAB — CUP PACEART REMOTE DEVICE CHECK
Battery Remaining Longevity: 102 mo
Battery Remaining Percentage: 100 %
Brady Statistic RA Percent Paced: 98 %
Brady Statistic RV Percent Paced: 100 %
Date Time Interrogation Session: 20250724000100
HighPow Impedance: 85 Ohm
Implantable Lead Connection Status: 753985
Implantable Lead Connection Status: 753985
Implantable Lead Connection Status: 753985
Implantable Lead Implant Date: 20250128
Implantable Lead Implant Date: 20250128
Implantable Lead Implant Date: 20250128
Implantable Lead Location: 753858
Implantable Lead Location: 753859
Implantable Lead Location: 753860
Implantable Lead Model: 4671
Implantable Lead Model: 673
Implantable Lead Model: 7841
Implantable Lead Serial Number: 1532553
Implantable Lead Serial Number: 257585
Implantable Lead Serial Number: 893656
Implantable Pulse Generator Implant Date: 20250128
Lead Channel Impedance Value: 583 Ohm
Lead Channel Impedance Value: 600 Ohm
Lead Channel Impedance Value: 738 Ohm
Lead Channel Pacing Threshold Amplitude: 0.4 V
Lead Channel Pacing Threshold Amplitude: 1.8 V
Lead Channel Pacing Threshold Pulse Width: 0.4 ms
Lead Channel Pacing Threshold Pulse Width: 1 ms
Lead Channel Setting Pacing Amplitude: 2 V
Lead Channel Setting Pacing Amplitude: 2 V
Lead Channel Setting Pacing Amplitude: 3.4 V
Lead Channel Setting Pacing Pulse Width: 0.4 ms
Lead Channel Setting Pacing Pulse Width: 1 ms
Lead Channel Setting Sensing Sensitivity: 0.5 mV
Lead Channel Setting Sensing Sensitivity: 1 mV
Pulse Gen Serial Number: 352446

## 2024-07-05 NOTE — Telephone Encounter (Signed)
 Copied from CRM [id number]. Topic: General - Other >> Jul 05, 2024  8:26 AM Rosina BIRCH wrote: Reason for CRM: patient called stating he need to get a handicap form filled out for him and his wife CB [phone]  Forms placed on provider's desk for signature. Patient will pick up on Monday when provider returns.

## 2024-07-08 ENCOUNTER — Ambulatory Visit: Payer: Self-pay | Admitting: Cardiology

## 2024-07-09 NOTE — Telephone Encounter (Signed)
 Patient notified that forms are completed and placed up front for pick-up.

## 2024-07-10 ENCOUNTER — Ambulatory Visit: Admitting: Physical Therapy

## 2024-07-18 ENCOUNTER — Ambulatory Visit (HOSPITAL_COMMUNITY)
Admission: RE | Admit: 2024-07-18 | Discharge: 2024-07-18 | Discharge status: Home / Self Care | Source: Ambulatory Visit | Attending: Cardiology | Admitting: Cardiology

## 2024-07-18 DIAGNOSIS — I110 Hypertensive heart disease with heart failure: Secondary | ICD-10-CM | POA: Diagnosis not present

## 2024-07-18 DIAGNOSIS — I5042 Chronic combined systolic (congestive) and diastolic (congestive) heart failure: Secondary | ICD-10-CM | POA: Insufficient documentation

## 2024-07-18 LAB — ECHOCARDIOGRAM COMPLETE
Area-P 1/2: 2.44 cm2
Calc EF: 54.5 %
S' Lateral: 3.7 cm
Single Plane A2C EF: 48.3 %
Single Plane A4C EF: 59.9 %

## 2024-07-20 ENCOUNTER — Other Ambulatory Visit: Payer: Self-pay | Admitting: Medical Genetics

## 2024-07-27 ENCOUNTER — Encounter: Payer: Self-pay | Admitting: Family Medicine

## 2024-07-27 ENCOUNTER — Ambulatory Visit: Admitting: Pulmonary Disease

## 2024-07-27 ENCOUNTER — Ambulatory Visit (INDEPENDENT_AMBULATORY_CARE_PROVIDER_SITE_OTHER): Admitting: Family Medicine

## 2024-07-27 VITALS — BP 104/65 | HR 60 | Temp 98.1°F | Resp 18 | Ht 70.0 in | Wt 199.0 lb

## 2024-07-27 DIAGNOSIS — N401 Enlarged prostate with lower urinary tract symptoms: Secondary | ICD-10-CM | POA: Diagnosis not present

## 2024-07-27 DIAGNOSIS — Z7984 Long term (current) use of oral hypoglycemic drugs: Secondary | ICD-10-CM

## 2024-07-27 DIAGNOSIS — I5042 Chronic combined systolic (congestive) and diastolic (congestive) heart failure: Secondary | ICD-10-CM | POA: Diagnosis not present

## 2024-07-27 DIAGNOSIS — R3911 Hesitancy of micturition: Secondary | ICD-10-CM

## 2024-07-27 DIAGNOSIS — E1159 Type 2 diabetes mellitus with other circulatory complications: Secondary | ICD-10-CM | POA: Diagnosis not present

## 2024-07-27 DIAGNOSIS — K50118 Crohn's disease of large intestine with other complication: Secondary | ICD-10-CM

## 2024-07-27 NOTE — Patient Instructions (Signed)
 It was very nice to see you today!  See if pulmonary at the [facility].  You have a referral in for [facility].  Please call them at [phone] to set up your appointment if you have not been contacted by their office within 3-5 business days.     PLEASE NOTE:  If you had any lab tests please let us  know if you have not heard back within a few days. You may see your results on MyChart before we have a chance to review them but we will give you a call once they are reviewed by us . If we ordered any referrals today, please let us  know if you have not heard from their office within the next week.   Please try these tips to maintain a healthy lifestyle:  Eat most of your calories during the day when you are active. Eliminate processed foods including packaged sweets (pies, cakes, cookies), reduce intake of potatoes, white bread, white pasta, and white rice. Look for whole grain options, oat flour or almond flour.  Each meal should contain half fruits/vegetables, one quarter protein, and one quarter carbs (no bigger than a computer mouse).  Cut down on sweet beverages. This includes juice, soda, and sweet tea. Also watch fruit intake, though this is a healthier sweet option, it still contains natural sugar! Limit to 3 servings daily.  Drink at least 1 glass of water with each meal and aim for at least 8 glasses per day  Exercise at least 150 minutes every week.

## 2024-07-27 NOTE — Progress Notes (Signed)
 Subjective:     Patient ID: Brent Arnold, male    DOB: July 16, 1941, 83 y.o.   MRN: [id number]  Chief Complaint  Patient presents with   Medical Management of Chronic Issues    4 week follow-up     HPI Discussed the use of AI scribe software for clinical note transcription with the patient, who gave verbal consent to proceed.  History of Present Illness Brent Arnold is an 83 year old male with heart failure and Crohn's disease who presents for follow-up on his chronic conditions. Here w/wife  He has a history of heart failure and experiences significant fatigue, limiting his ability to walk more than 100 feet without resting. He uses a rollator for support and frequently sits during outings. He has transitioned from using a walker to a cane but continues to experience fatigue. No chest pain or shortness of breath is noted. He is currently taking empagliflozin  for heart failure and diabetes.  His Crohn's disease has been flaring up recently. He manages occasional flare-ups with an ammonium cocktail, which provides relief. Stress and certain foods can exacerbate his symptoms, but sometimes there is no clear trigger.  DM type 2-He monitors his blood glucose daily, with readings around 120-125 mg/dL before breakfast. His last A1c was in the 6 range. He takes metformin  once daily and empagliflozin  for diabetes management.  He has a history of prostate issues and is taking tamsulosin  and finasteride . He attempted to reduce his tamsulosin  dose but experienced urinary hesitancy, prompting him to resume the previous dose. He has a history of urinary tract infections and is cautious about urinary retention.  No current issues with asthma or leg swelling are reported. He denies depression but acknowledges occasional grouchiness, which he attributes to fatigue.    Health Maintenance Due  Topic Date Due   HEMOGLOBIN A1C  Never done   Medicare Annual Wellness (AWV)  Never done    OPHTHALMOLOGY EXAM  Never done   Diabetic kidney evaluation - Urine ACR  Never done   INFLUENZA VACCINE  07/13/2024    Past Medical History:  Diagnosis Date   A-fib (HCC)    Allergy    [place]   Antiphospholipid antibody syndrome (HCC)    Arthritis    Asthma    Cataract 2014   Removed   CHF (congestive heart failure), NYHA class III, chronic, combined (HCC)    Crohn disease (HCC)    DVT (deep venous thrombosis) (HCC)    GERD (gastroesophageal reflux disease) 2022   Hypertension    Myocardial infarction (HCC) 2002   Seizures (HCC) 1092   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   Stroke (HCC) 1992    Past Surgical History:  Procedure Laterality Date   BACK SURGERY     CARDIAC SURGERY     CORONARY ARTERY BYPASS GRAFT  2001   2001   EYE SURGERY  2014   Caterax replacement   HERNIA REPAIR  2015   umbilical   PACEMAKER INSERTION     SPINE SURGERY  Feb 03, 2023   Releaved tendion on nerve lumbar   TONSILLECTOMY       Current Outpatient Medications:    ACCU-CHEK GUIDE TEST test strip, 1 strip by Other route., Disp: , Rfl:    Accu-Chek Softclix Lancets lancets, 1 each by Other route., Disp: , Rfl:    acetaminophen  (TYLENOL ) 650 MG CR tablet, Take 650 mg by mouth at bedtime as needed for pain., Disp: , Rfl:  amiodarone (PACERONE) 200 MG tablet, Take 1 tablet by mouth daily., Disp: , Rfl:    apixaban (ELIQUIS) 5 MG TABS tablet, Take 1 tablet (5 mg total) by mouth 2 (two) times daily., Disp: 60 tablet, Rfl: 11   aspirin 81 MG chewable tablet, Chew 1 tablet by mouth daily., Disp: , Rfl:    atorvastatin (LIPITOR) 80 MG tablet, Take 1 tablet (80 mg total) by mouth daily., Disp: 90 tablet, Rfl: 3   Blood Glucose Monitoring Suppl (ACCU-CHEK GUIDE ME) w/Device KIT, See admin instructions., Disp: , Rfl:    BREO ELLIPTA 200-25 MCG/ACT AEPB, Inhale 1 puff into the lungs daily., Disp: , Rfl:    budesonide (ENTOCORT EC) 3 MG 24 hr capsule, Take 1 capsule (3 mg total) by mouth in the morning and  at bedtime., Disp: 180 capsule, Rfl: 0   calcium carbonate (OSCAL) 1500 (600 Ca) MG TABS tablet, Take 600 mg by mouth daily with breakfast., Disp: , Rfl:    celecoxib (CELEBREX) 100 MG capsule, Take 100 mg by mouth at bedtime., Disp: , Rfl:    Cholecalciferol 100 MCG (4000 UT) TABS, Take 4,000 Units by mouth daily., Disp: , Rfl:    cyanocobalamin (VITAMIN B12) 1000 MCG/ML injection, Inject 1 mL (1,000 mcg total) into the muscle once a week., Disp: 12 mL, Rfl: 3   empagliflozin (JARDIANCE) 10 MG TABS tablet, Take 1 tablet (10 mg total) by mouth daily., Disp: 30 tablet, Rfl: 11   ENTRESTO 24-26 MG, Take 1 tablet by mouth 2 (two) times daily., Disp: , Rfl:    esomeprazole (NEXIUM) 40 MG capsule, Take 40 mg by mouth at bedtime., Disp: , Rfl:    ezetimibe (ZETIA) 10 MG tablet, Take 1 tablet (10 mg total) by mouth daily., Disp: 90 tablet, Rfl: 3   ferrous sulfate 325 (65 FE) MG tablet, Take 1 tablet by mouth daily with breakfast., Disp: , Rfl:    finasteride (PROSCAR) 5 MG tablet, Take 1 tablet by mouth at bedtime., Disp: , Rfl:    hydrocortisone cream 0.5 %, Apply 1 Application topically as needed for itching., Disp: , Rfl:    ipratropium-albuterol (DUONEB) 0.5-2.5 (3) MG/3ML SOLN, Inhale 3 mLs into the lungs every 4 (four) hours as needed (for wheezing and shortness of breath)., Disp: , Rfl:    magnesium oxide (MAG-OX) 400 MG tablet, Take 400 mg by mouth 2 (two) times daily., Disp: , Rfl:    metFORMIN (GLUCOPHAGE-XR) 500 MG 24 hr tablet, Take 1 tablet (500 mg total) by mouth daily with breakfast., Disp: 90 tablet, Rfl: 1   metoprolol succinate (TOPROL-XL) 50 MG 24 hr tablet, Take 1 tablet (50 mg total) by mouth at bedtime., Disp: 90 tablet, Rfl: 3   montelukast (SINGULAIR) 10 MG tablet, Take 10 mg by mouth daily., Disp: , Rfl:    nitroGLYCERIN (NITROSTAT) 0.4 MG SL tablet, Place 0.4 mg under the tongue every 5 (five) minutes as needed for chest pain., Disp: , Rfl:    ondansetron (ZOFRAN) 4 MG tablet,  Take 1 tablet (4 mg total) by mouth every 6 (six) hours as needed for nausea or vomiting., Disp: 20 tablet, Rfl: 0   spironolactone (ALDACTONE) 25 MG tablet, Take 1 tablet (25 mg total) by mouth at bedtime., Disp: , Rfl:    tamsulosin (FLOMAX) 0.4 MG CAPS capsule, Take 1 capsule (0.4 mg total) by mouth in the morning and at bedtime., Disp: 180 capsule, Rfl: 1   testosterone cypionate (DEPOTESTOSTERONE CYPIONATE) 200 MG/ML injection, Inject 0.5 mLs (  100 mg total) into the muscle once a week. Wednesday, Disp: 6 mL, Rfl: 1   torsemide (DEMADEX) 10 MG tablet, Take 1 tablet by mouth daily., Disp: , Rfl:   Allergies  Allergen Reactions   Gabapentin Rash    Other Reaction(s): Swelling   Morphine Nausea And Vomiting    severe vomiting   ROS neg/noncontributory except as noted HPI/below      Objective:     BP 104/65   Pulse 60   Temp 98.1 F (36.7 C) (Temporal)   Resp 18   Ht 5' 10 (1.778 m)   Wt 199 lb (90.3 kg)   SpO2 96%   BMI 28.55 kg/m  Wt Readings from Last 3 Encounters:  07/27/24 199 lb (90.3 kg)  06/29/24 201 lb 9.6 oz (91.4 kg)  06/26/24 201 lb 8 oz (91.4 kg)    Physical Exam   Gen: WDWN NAD HEENT: NCAT, conjunctiva not injected, sclera nonicteric NECK:  supple, no thyromegaly, no nodes, no carotid bruits CARDIAC: RRR, S1S2+,+2-3/6 murmur. DP 1+B LUNGS: CTAB. No wheezes ABDOMEN:  BS+, soft, NTND, No HSM, no masses EXT:  chronic 1+ edema-wearing compression stockings MSK: no gross abnormalities.  cane NEURO: A&O x3.  CN II-XII intact.  PSYCH: normal mood. Good eye contact Diabetic Foot Exam - Simple   Simple Foot Form Diabetic Foot exam was performed with the following findings: Yes 07/27/2024  3:16 PM  Visual Inspection See comments: Yes Sensation Testing See comments: Yes Pulse Check Posterior Tibialis and Dorsalis pulse intact bilaterally: Yes Comments Decreased sens R>L.  Fungal nails.          Assessment & Plan:  Type 2 diabetes mellitus with other  circulatory complication, without long-term current use of insulin (HCC)  Chronic combined systolic and diastolic congestive heart failure (HCC)  Benign prostatic hyperplasia with urinary hesitancy  Crohn's disease of large intestine with other complication (HCC)  Long term current use of oral hypoglycemic drug  Assessment and Plan Assessment & Plan Heart failure   Heart failure is well-managed with no chest pain or dyspnea, though fatigue occurs after minimal exertion, such as walking 100 feet. Peripheral edema has improved. He uses a rollator for support during activities like grocery shopping.  Type 2 diabetes mellitus   Type 2 diabetes mellitus is well-controlled with blood glucose levels consistently around 120-125 mg/dL fasting and a recent J8r in the 6% range. He is on metformin and empagliflozin, which also aids in heart failure management. Continue metformin and empagliflozin. Monitor blood glucose levels regularly.  Crohn's disease   Crohn's disease is generally under control but has been exacerbated recently with occasional flare-ups triggered by certain foods or stress. He uses an ammonium cocktail to manage symptoms during flare-ups. Continue current management of Crohn's disease and use ammonium cocktail as needed for flare-ups. Pt wtill working on records to sch w/GI  Benign prostatic hyperplasia with lower urinary tract symptoms   Benign prostatic hyperplasia with lower urinary tract symptoms is managed with tamsulosin and finasteride. He resumed tamsulosin due to urinary symptoms after attempting reduction. Tamsulosin relaxes the urinary tract, while finasteride aids in prostate shrinkage. He is concerned about urinary retention and has a history of catheterization. Continue tamsulosin and finasteride. Monitor urinary symptoms and contact urologist if symptoms worsen.  Hyperlipidemia   Hyperlipidemia is managed with atorvastatin and Zetia. The atorvastatin prescription from  [place]  is about to expire. Zetia was recently added to enhance cholesterol management. Ensure atorvastatin prescription is filled and continue Zetia  for cholesterol management.  Hypertension   Hypertension is well-controlled. Blood pressure management was optimized by switching medication timing to nighttime, improving control. Continue current blood pressure management regimen and monitor blood pressure regularly.    Return in about 6 months (around 01/27/2025) for chronic follow-up.  Jenkins CHRISTELLA Carrel, MD

## 2024-07-30 ENCOUNTER — Other Ambulatory Visit (HOSPITAL_BASED_OUTPATIENT_CLINIC_OR_DEPARTMENT_OTHER): Payer: Self-pay

## 2024-07-31 ENCOUNTER — Other Ambulatory Visit (HOSPITAL_COMMUNITY)
Admission: RE | Admit: 2024-07-31 | Discharge: 2024-07-31 | Disposition: A | Discharge status: Home / Self Care | Payer: Self-pay | Source: Ambulatory Visit | Attending: Oncology | Admitting: Oncology

## 2024-08-04 ENCOUNTER — Encounter (HOSPITAL_COMMUNITY): Payer: Self-pay | Admitting: Cardiology

## 2024-08-06 MED ORDER — AMIODARONE HCL 200 MG PO TABS: 200.0000 mg | ORAL_TABLET | Freq: Every day | ORAL | 3 refills | Status: AC

## 2024-08-07 LAB — GENECONNECT MOLECULAR SCREEN: Genetic Analysis Overall Interpretation: NEGATIVE

## 2024-08-14 ENCOUNTER — Other Ambulatory Visit: Payer: Self-pay

## 2024-08-15 ENCOUNTER — Encounter (HOSPITAL_COMMUNITY): Payer: Self-pay | Admitting: Cardiology

## 2024-08-15 ENCOUNTER — Other Ambulatory Visit (HOSPITAL_BASED_OUTPATIENT_CLINIC_OR_DEPARTMENT_OTHER): Payer: Self-pay

## 2024-08-15 MED ORDER — TAMSULOSIN HCL 0.4 MG PO CAPS
0.4000 mg | ORAL_CAPSULE | Freq: Two times a day (BID) | ORAL | 11 refills | Status: DC
  Filled 2024-08-15: qty 60, 30d supply, fill #0

## 2024-08-16 ENCOUNTER — Other Ambulatory Visit: Payer: Self-pay | Admitting: Cardiology

## 2024-08-16 ENCOUNTER — Ambulatory Visit (HOSPITAL_BASED_OUTPATIENT_CLINIC_OR_DEPARTMENT_OTHER): Admitting: Physical Therapy

## 2024-08-17 NOTE — Telephone Encounter (Signed)
 Medication has an expired date, no longer available. Please resend

## 2024-08-22 ENCOUNTER — Other Ambulatory Visit (HOSPITAL_BASED_OUTPATIENT_CLINIC_OR_DEPARTMENT_OTHER): Payer: Self-pay

## 2024-08-22 ENCOUNTER — Other Ambulatory Visit: Payer: Self-pay

## 2024-08-23 MED ORDER — EZETIMIBE 10 MG PO TABS: 10.0000 mg | ORAL_TABLET | Freq: Every day | ORAL | 3 refills | Status: DC

## 2024-08-24 ENCOUNTER — Ambulatory Visit (HOSPITAL_COMMUNITY)
Admission: RE | Admit: 2024-08-24 | Discharge: 2024-08-24 | Disposition: A | Discharge status: Home / Self Care | Source: Ambulatory Visit | Attending: Cardiology | Admitting: Cardiology

## 2024-08-24 ENCOUNTER — Encounter (HOSPITAL_COMMUNITY): Payer: Self-pay | Admitting: Cardiology

## 2024-08-24 ENCOUNTER — Ambulatory Visit (HOSPITAL_COMMUNITY): Payer: Self-pay | Admitting: Cardiology

## 2024-08-24 VITALS — BP 94/60 | HR 60

## 2024-08-24 DIAGNOSIS — Z8673 Personal history of transient ischemic attack (TIA), and cerebral infarction without residual deficits: Secondary | ICD-10-CM | POA: Insufficient documentation

## 2024-08-24 DIAGNOSIS — R9431 Abnormal electrocardiogram [ECG] [EKG]: Secondary | ICD-10-CM | POA: Insufficient documentation

## 2024-08-24 DIAGNOSIS — I5042 Chronic combined systolic (congestive) and diastolic (congestive) heart failure: Secondary | ICD-10-CM | POA: Diagnosis not present

## 2024-08-24 DIAGNOSIS — Z951 Presence of aortocoronary bypass graft: Secondary | ICD-10-CM | POA: Diagnosis not present

## 2024-08-24 DIAGNOSIS — Z79899 Other long term (current) drug therapy: Secondary | ICD-10-CM | POA: Insufficient documentation

## 2024-08-24 DIAGNOSIS — Z7982 Long term (current) use of aspirin: Secondary | ICD-10-CM | POA: Insufficient documentation

## 2024-08-24 DIAGNOSIS — I480 Paroxysmal atrial fibrillation: Secondary | ICD-10-CM | POA: Diagnosis not present

## 2024-08-24 DIAGNOSIS — I255 Ischemic cardiomyopathy: Secondary | ICD-10-CM | POA: Insufficient documentation

## 2024-08-24 DIAGNOSIS — Z86718 Personal history of other venous thrombosis and embolism: Secondary | ICD-10-CM | POA: Insufficient documentation

## 2024-08-24 DIAGNOSIS — Z7901 Long term (current) use of anticoagulants: Secondary | ICD-10-CM | POA: Insufficient documentation

## 2024-08-24 DIAGNOSIS — Z8249 Family history of ischemic heart disease and other diseases of the circulatory system: Secondary | ICD-10-CM | POA: Insufficient documentation

## 2024-08-24 DIAGNOSIS — I442 Atrioventricular block, complete: Secondary | ICD-10-CM | POA: Diagnosis not present

## 2024-08-24 DIAGNOSIS — I2510 Atherosclerotic heart disease of native coronary artery without angina pectoris: Secondary | ICD-10-CM | POA: Diagnosis not present

## 2024-08-24 DIAGNOSIS — I5022 Chronic systolic (congestive) heart failure: Secondary | ICD-10-CM | POA: Insufficient documentation

## 2024-08-24 DIAGNOSIS — Z95810 Presence of automatic (implantable) cardiac defibrillator: Secondary | ICD-10-CM | POA: Diagnosis not present

## 2024-08-24 LAB — COMPREHENSIVE METABOLIC PANEL WITH GFR
ALT: 37 U/L (ref 0–44)
AST: 32 U/L (ref 15–41)
Albumin: 3.5 g/dL (ref 3.5–5.0)
Alkaline Phosphatase: 48 U/L (ref 38–126)
Anion gap: 10 (ref 5–15)
BUN: 41 mg/dL — ABNORMAL HIGH (ref 8–23)
CO2: 21 mmol/L — ABNORMAL LOW (ref 22–32)
Calcium: 8.8 mg/dL — ABNORMAL LOW (ref 8.9–10.3)
Chloride: 106 mmol/L (ref 98–111)
Creatinine, Ser: 1.18 mg/dL (ref 0.61–1.24)
GFR, Estimated: >60 mL/min (ref >60)
Glucose, Bld: 116 mg/dL — ABNORMAL HIGH (ref 70–99)
Potassium: 4.6 mmol/L (ref 3.5–5.1)
Sodium: 137 mmol/L (ref 135–145)
Total Bilirubin: 1 mg/dL (ref 0.0–1.2)
Total Protein: 6.4 g/dL — ABNORMAL LOW (ref 6.5–8.1)

## 2024-08-24 LAB — CBC
HCT: 57.4 % — ABNORMAL HIGH (ref 39.0–52.0)
Hemoglobin: 18.6 g/dL — ABNORMAL HIGH (ref 13.0–17.0)
MCH: 29.8 pg (ref 26.0–34.0)
MCHC: 32.4 g/dL (ref 30.0–36.0)
MCV: 92 fL (ref 80.0–100.0)
Platelets: 192 K/uL (ref 150–400)
RBC: 6.24 MIL/uL — ABNORMAL HIGH (ref 4.22–5.81)
RDW: 21.3 % — ABNORMAL HIGH (ref 11.5–15.5)
WBC: 8.6 K/uL (ref 4.0–10.5)
nRBC: 0 % (ref 0.0–0.2)

## 2024-08-24 LAB — LIPID PANEL
Cholesterol: 116 mg/dL (ref 0–200)
HDL: 36 mg/dL — ABNORMAL LOW (ref >40)
LDL Cholesterol: 64 mg/dL (ref 0–99)
Total CHOL/HDL Ratio: 3.2 ratio
Triglycerides: 82 mg/dL (ref <150)
VLDL: 16 mg/dL (ref 0–40)

## 2024-08-24 MED ORDER — TORSEMIDE 10 MG PO TABS: 10.0000 mg | ORAL_TABLET | ORAL | Status: DC

## 2024-08-24 NOTE — Patient Instructions (Signed)
 There has been no changes to your medications.  Labs done today, your results will be available in MyChart, we will contact you for abnormal readings.  Your physician recommends that you schedule a follow-up appointment in: 3 months.  If you have any questions or concerns before your next appointment please send us  a message through [place] or call our office at [phone].    TO LEAVE A MESSAGE FOR THE NURSE SELECT OPTION 2, PLEASE LEAVE A MESSAGE INCLUDING: YOUR NAME DATE OF BIRTH CALL BACK NUMBER REASON FOR CALL**this is important as we prioritize the call backs  YOU WILL RECEIVE A CALL BACK THE SAME DAY AS LONG AS YOU CALL BEFORE 4:00 PM  At the [facility], you and your health needs are our priority. As part of our continuing mission to provide you with exceptional heart care, we have created designated Provider Care Teams. These Care Teams include your primary Cardiologist (physician) and Advanced Practice Providers (APPs- Physician Assistants and Nurse Practitioners) who all work together to provide you with the care you need, when you need it.   You may see any of the following providers on your designated Care Team at your next follow up: Dr Jules Oar Dr Peder Bourdon Dr. Alwin Baars Dr. Arta Lark Amy Marijane Shoulders, NP Ruddy Corral, [place] [facility] [place], [place] Dennise Fitz, NP [place] Lee, NP Shawnee Dellen, NP Luster Salters, PharmD Bevely Brush, PharmD   Please be sure to bring in all your medications bottles to every appointment.    Thank you for choosing [facility]

## 2024-08-24 NOTE — Telephone Encounter (Signed)
-----   Message from Ezra Shuck sent at 08/24/2024  4:31 PM EDT ----- BUN high, can decrease torsemide  to 10 mg every other day.  BMET 10 days.  ----- Message ----- From: Rebecka, Lab [facility] [place] Sent: 08/24/2024   1:42 PM EDT To: Ezra GORMAN Shuck, MD

## 2024-08-25 NOTE — Progress Notes (Signed)
 PCP: Wendolyn Jenkins Jansky, MD HF Cardiology: Dr. Rolan  Chief complaint: CHF  83 y.o. with history of CAD s/p CABG, ischemic cardiomyopathy, CVA, DVT, and paroxysmal atrial fibrillation was referred by Dr. Wendolyn for evaluation of CHF and CAD. Patient had CABG in 2001 in [place]  with LIMA-LAD, free RIMA-dRCA, sequential SVG-D1 and OM3. Patient was admitted in 1/25 in [place]  with CHF.  Echo showed EF 36%, mild LVH, PASP 31 mmHg, aortic sclerosis without significant stenosis. LHC 1/25 showed patent LIMA-LAD, free RIMA-dRCA.  Sequential SVG-D1 and OM3 with occluded branch to OM3 and 50% stenosis of branch to D1. 99% LM, 90% heavily calcified proximal LCx.  Small LCx territory, decided against PCI LM-LCx given extensive disease and small territory supplied. Patient had episodes of complete heart block this hospitalization and [employer] CRT-D device was placed.  Followup echo in 4/25 showed EF 44%, normal RV.   Echo in 8/25 showed EF 50-55%, normal RV, normal IVC.   Patient returns for followup of CHF.  Weight is up a few pounds.  He has some generalized fatigue but no exertional dyspnea or chest pain.  No orthopnea/PND.  No palpitations.  No lightheadedness. He walks with a cane for balance.    ECG (personally reviewed): A-BiV pacing  [employer] device interrogation: 100% BiV pacing, 0% AF, HeartLogic 1  Labs (6/25): K 4.8, creatinine 1.08, LFTs normal Labs (8/25): Transferrin saturation 31%, LDL 65, TSH normal, BNP 240, K 4.8, creatinine 1.12, LFTs normal  PMH: 1. CAD: S/p CABG 2001 with LIMA-LAD, free RIMA-dRCA, sequential SVG-D1 and OM3.  - LHC 1/25 showed patent LIMA-LAD, free RIMA-dRCA.  Sequential SVG-D1 and OM3 with occluded branch to OM3 and 50% stenosis of branch to D1. 99% LM, 90% heavily calcified proximal LCx.  Small LCx territory, decided against PCI LM-LCx given extensive disease and small territory supplied.  2. Chronic systolic CHF: Ischemic cardiomyopathy.  [employer] CRT-D device.  - Echo (1/25): EF 36%, mild LVH, PASP 31 mmHg, aortic sclerosis without significant stenosis.   - Echo (4/25): EF 44%, normal RV.  - Echo (8/25): EF 50-55%, normal RV, normal IVC.  3. CVA 4. 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  5. Atrial fibrillation: Paroxysmal 6. Asthma/bronchiectasis 7. Crohns disease 8. Benign pancreatic cyst 9. Complete heart block: [employer] CRT-D.   SH: Married, from [place] , moved to [place] to live near daughter.  Nonsmoker, no ETOH.   Family History  Problem Relation Age of Onset   Arthritis Mother    Heart disease Mother 50   Cancer Father 48       colon   Diabetes Sister    ROS: All systems reviewed and negative except as per HPI.   Current Outpatient Medications  Medication Sig Dispense Refill   ACCU-CHEK GUIDE TEST test strip 1 strip by Other route.     Accu-Chek Softclix Lancets lancets 1 each by Other route.     acetaminophen  (TYLENOL ) 650 MG CR tablet Take 650 mg by mouth 2 (two) times daily.     amiodarone  (PACERONE ) 200 MG tablet Take 1 tablet (200 mg total) by mouth daily. 90 tablet 3   apixaban  (ELIQUIS ) 5 MG TABS tablet Take 1 tablet (5 mg total) by mouth 2 (two) times daily. 60 tablet 11   aspirin 81 MG chewable tablet Chew 1 tablet by mouth daily.     atorvastatin  (LIPITOR) 80 MG tablet Take 1 tablet (80 mg total) by mouth daily. 90 tablet 3   Blood Glucose Monitoring Suppl (ACCU-CHEK  GUIDE ME) w/Device KIT See admin instructions.     BREO ELLIPTA 200-25 MCG/ACT AEPB Inhale 1 puff into the lungs daily.     budesonide  (ENTOCORT EC ) 3 MG 24 hr capsule Take 1 capsule (3 mg total) by mouth in the morning and at bedtime. 180 capsule 0   calcium  carbonate (OSCAL) 1500 (600 Ca) MG TABS tablet Take 600 mg by mouth daily with breakfast.     cyanocobalamin  (VITAMIN B12) 1000 MCG/ML injection Inject 1 mL (1,000 mcg total) into the muscle once a week. 12 mL 3   empagliflozin  (JARDIANCE ) 10 MG TABS tablet Take 1 tablet (10  mg total) by mouth daily. 30 tablet 11   ENTRESTO 24-26 MG Take 1 tablet by mouth 2 (two) times daily.     esomeprazole (NEXIUM) 40 MG capsule Take 40 mg by mouth at bedtime.     ezetimibe  (ZETIA ) 10 MG tablet Take 1 tablet (10 mg total) by mouth daily. 90 tablet 3   ferrous sulfate 325 (65 FE) MG tablet Take 1 tablet by mouth daily with breakfast.     hydrocortisone cream 0.5 % Apply 1 Application topically as needed for itching.     ipratropium-albuterol (DUONEB) 0.5-2.5 (3) MG/3ML SOLN Inhale 3 mLs into the lungs every 4 (four) hours as needed (for wheezing and shortness of breath).     metFORMIN  (GLUCOPHAGE -XR) 500 MG 24 hr tablet Take 1 tablet (500 mg total) by mouth daily with breakfast. 90 tablet 1   metoprolol  succinate (TOPROL -XL) 50 MG 24 hr tablet Take 1 tablet (50 mg total) by mouth at bedtime. 90 tablet 3   montelukast  (SINGULAIR ) 10 MG tablet Take 10 mg by mouth daily.     nitroGLYCERIN (NITROSTAT) 0.4 MG SL tablet Place 0.4 mg under the tongue every 5 (five) minutes as needed for chest pain.     spironolactone  (ALDACTONE ) 25 MG tablet Take 1 tablet (25 mg total) by mouth at bedtime.     tamsulosin  (FLOMAX ) 0.4 MG CAPS capsule Take 1 capsule (0.4 mg total) by mouth in the morning and at bedtime. 180 capsule 1   testosterone  cypionate (DEPOTESTOSTERONE CYPIONATE) 200 MG/ML injection Inject 0.5 mLs (100 mg total) into the muscle once a week. Wednesday 6 mL 1   torsemide  (DEMADEX ) 10 MG tablet Take 1 tablet (10 mg total) by mouth every other day.     No current facility-administered medications for this encounter.   BP 94/60   Pulse 60   SpO2 95%  General: NAD Neck: No JVD, no thyromegaly or thyroid  nodule.  Lungs: Clear to auscultation bilaterally with normal respiratory effort. CV: Nondisplaced PMI.  Heart regular S1/S2, no S3/S4, no murmur.  No peripheral edema.  No carotid bruit.  Normal pedal pulses.  Abdomen: Soft, nontender, no hepatosplenomegaly, no distention.  Skin:  Intact without lesions or rashes.  Neurologic: Alert and oriented x 3.  Psych: Normal affect. Extremities: No clubbing or cyanosis.  HEENT: Normal.   Assessment/Plan: 1. Chronic systolic CHF: Ischemic cardiomyopathy.  Echo in 1/25 with EF 36%, mild LVH.  Echo in 4/25 with EF 44%.  He has a [employer] CRT-D device.  Echo in 8/25 showed EF 50-55%, normal RV, normal IVC.  He is not volume overloaded by exam or HeartLogic.  NYHA class II.  - Continue Jardiance  10 mg daily.  - Continue spironolactone  25 mg daily.  - Continue Toprol  XL 25 mg daily.   - Continue Entresto 24/26 bid.  - Continue torsemide  10 mg daily,  BMET/BNP today.  2. CAD: S/p CABG 2001.  On cath in 1/25, he had unrevascularized disease in the LCx system, but he has had no recent chest pain, and the LCx territory is small and extensively diseased so decision made to medically manage.  No chest pain.  - Continue ASA 81 - Continue Atorvastatin  80 mg daily and Zetia , goal LDL < 55.  Check lipids today.   3. Atrial fibrillation: Paroxysmal. He is in NSR and denies palpitations.  - Continue amiodarone  200 mg daily to maintain NSR.  Check LFTs today, recent TSH normal, he will need a regular eye exam.  - Continue apixaban .   4. H/o DVT: Not triggered.   - Continue apixaban .  5. Complete heart block: [employer] CRT-D device.   Followup in 2 months.   I spent 31 minutes reviewing records, interviewing/examining patient, and managing orders.    Ezra Shuck 08/25/2024

## 2024-08-27 ENCOUNTER — Other Ambulatory Visit (HOSPITAL_BASED_OUTPATIENT_CLINIC_OR_DEPARTMENT_OTHER): Payer: Self-pay

## 2024-08-28 ENCOUNTER — Other Ambulatory Visit (HOSPITAL_BASED_OUTPATIENT_CLINIC_OR_DEPARTMENT_OTHER): Payer: Self-pay

## 2024-09-01 ENCOUNTER — Other Ambulatory Visit (HOSPITAL_BASED_OUTPATIENT_CLINIC_OR_DEPARTMENT_OTHER): Payer: Self-pay

## 2024-09-01 ENCOUNTER — Encounter (HOSPITAL_COMMUNITY): Payer: Self-pay

## 2024-09-01 ENCOUNTER — Other Ambulatory Visit (HOSPITAL_COMMUNITY): Payer: Self-pay | Admitting: Cardiology

## 2024-09-03 ENCOUNTER — Other Ambulatory Visit (HOSPITAL_COMMUNITY)

## 2024-09-03 ENCOUNTER — Other Ambulatory Visit (HOSPITAL_BASED_OUTPATIENT_CLINIC_OR_DEPARTMENT_OTHER): Payer: Self-pay

## 2024-09-03 ENCOUNTER — Encounter (HOSPITAL_COMMUNITY): Payer: Self-pay

## 2024-09-03 MED ORDER — EZETIMIBE 10 MG PO TABS
10.0000 mg | ORAL_TABLET | Freq: Every day | ORAL | 3 refills | Status: DC
  Filled 2024-09-03 – 2024-09-05 (×2): qty 90, 90d supply, fill #0

## 2024-09-04 ENCOUNTER — Ambulatory Visit (INDEPENDENT_AMBULATORY_CARE_PROVIDER_SITE_OTHER)

## 2024-09-04 VITALS — Ht 70.0 in | Wt 202.0 lb

## 2024-09-04 DIAGNOSIS — Z0000 Encounter for general adult medical examination without abnormal findings: Secondary | ICD-10-CM | POA: Diagnosis not present

## 2024-09-04 NOTE — Patient Instructions (Signed)
 Mr. Clasby,  Thank you for taking the time for your Medicare Wellness Visit. I appreciate your continued commitment to your health goals. Please review the care plan we discussed, and feel free to reach out if I can assist you further.  Medicare recommends these wellness visits once per year to help you and your care team stay ahead of potential health issues. These visits are designed to focus on prevention, allowing your provider to concentrate on managing your acute and chronic conditions during your regular appointments.  Please note that Annual Wellness Visits do not include a physical exam. Some assessments may be limited, especially if the visit was conducted virtually. If needed, we may recommend a separate in-person follow-up with your provider.  Ongoing Care Seeing your primary care provider every 3 to 6 months helps us  monitor your health and provide consistent, personalized care.   Referrals If a referral was made during today's visit and you haven't received any updates within two weeks, please contact the referred provider directly to check on the status.  Recommended Screenings:  Health Maintenance  Topic Date Due   Hemoglobin A1C  Never done   Eye exam for diabetics  Never done   Yearly kidney health urinalysis for diabetes  Never done   Flu Shot  07/13/2024   COVID-19 Vaccine (6 - 2025-26 season) 08/13/2024   Zoster (Shingles) Vaccine (2 of 2) 12/06/2024*   Complete foot exam   07/27/2025   Yearly kidney function blood test for diabetes  08/24/2025   Medicare Annual Wellness Visit  09/04/2025   DTaP/Tdap/Td vaccine (2 - Td or Tdap) 11/23/2032   Pneumococcal Vaccine for age over 25  Completed   HPV Vaccine  Aged Out   Meningitis B Vaccine  Aged Out   Hepatitis B Vaccine  Discontinued  *Topic was postponed. The date shown is not the original due date.       04/11/2023    4:23 PM  Advanced Directives  Does Patient Have a Medical Advance Directive? Yes  Type of  Estate agent of [place];Living will  Does patient want to make changes to medical advance directive? No - Patient declined   Advance Care Planning is important because it: Ensures you receive medical care that aligns with your values, goals, and preferences. Provides guidance to your family and loved ones, reducing the emotional burden of decision-making during critical moments.  Vision: Annual vision screenings are recommended for early detection of glaucoma, cataracts, and diabetic retinopathy. These exams can also reveal signs of chronic conditions such as diabetes and high blood pressure.  Dental: Annual dental screenings help detect early signs of oral cancer, gum disease, and other conditions linked to overall health, including heart disease and diabetes.  Please see the attached documents for additional preventive care recommendations.

## 2024-09-04 NOTE — Progress Notes (Signed)
 Subjective:   Brent Arnold is a 83 y.o. who presents for a Medicare Wellness preventive visit.  As a reminder, Annual Wellness Visits don't include a physical exam, and some assessments may be limited, especially if this visit is performed virtually. We may recommend an in-person follow-up visit with your provider if needed.  Visit Complete: Virtual I connected with  Brent Arnold on 09/04/24 by a audio enabled telemedicine application and verified that I am speaking with the correct person using two identifiers.  Patient Location: Home  Provider Location: Office/Clinic  I discussed the limitations of evaluation and management by telemedicine. The patient expressed understanding and agreed to proceed.  Vital Signs: Because this visit was a virtual/telehealth visit, some criteria may be missing or patient reported. Any vitals not documented were not able to be obtained and vitals that have been documented are patient reported.  VideoDeclined- This patient declined Librarian, academic. Therefore the visit was completed with audio only.  Persons Participating in Visit: Patient.  AWV Questionnaire: Yes: Patient Medicare AWV questionnaire was completed by the patient on 09/03/24; I have confirmed that all information answered by patient is correct and no changes since this date.  Cardiac Risk Factors include: advanced age (>80men, >85 women);diabetes mellitus;dyslipidemia;hypertension;male gender     Objective:    Today's Vitals   09/04/24 1301  Weight: 202 lb (91.6 kg)  Height: 5' 10 (1.778 m)   Body mass index is 28.98 kg/m.     09/04/2024    1:13 PM 04/11/2023    4:23 PM 04/10/2023    4:47 PM  Advanced Directives  Does Patient Have a Medical Advance Directive? No Yes Yes  Type of Special educational needs teacher of [place];Living will Healthcare Power of [place];Living will  Does patient want to make changes to medical advance directive?   No - Patient declined   Copy of Healthcare Power of Attorney in Chart?   No - copy requested, Physician notified  Would patient like information on creating a medical advance directive? Yes (MAU/Ambulatory/Procedural Areas - Information given)      Current Medications (verified) Outpatient Encounter Medications as of 09/04/2024  Medication Sig   ACCU-CHEK GUIDE TEST test strip 1 strip by Other route.   Accu-Chek Softclix Lancets lancets 1 each by Other route.   acetaminophen  (TYLENOL ) 650 MG CR tablet Take 650 mg by mouth 2 (two) times daily.   amiodarone  (PACERONE ) 200 MG tablet Take 1 tablet (200 mg total) by mouth daily.   apixaban  (ELIQUIS ) 5 MG TABS tablet Take 1 tablet (5 mg total) by mouth 2 (two) times daily.   Ascorbic Acid (VITAMIN C PO) Take by mouth.   aspirin 81 MG chewable tablet Chew 1 tablet by mouth daily.   atorvastatin  (LIPITOR) 80 MG tablet Take 1 tablet (80 mg total) by mouth daily.   beta carotene 15 MG capsule Take 15 mg by mouth daily.   Blood Glucose Monitoring Suppl (ACCU-CHEK GUIDE ME) w/Device KIT See admin instructions.   BREO ELLIPTA 200-25 MCG/ACT AEPB Inhale 1 puff into the lungs daily.   budesonide  (ENTOCORT EC ) 3 MG 24 hr capsule Take 1 capsule (3 mg total) by mouth in the morning and at bedtime.   calcium  carbonate (OSCAL) 1500 (600 Ca) MG TABS tablet Take 600 mg by mouth daily with breakfast.   cyanocobalamin  (VITAMIN B12) 1000 MCG/ML injection Inject 1 mL (1,000 mcg total) into the muscle once a week.   empagliflozin  (JARDIANCE ) 10 MG  TABS tablet Take 1 tablet (10 mg total) by mouth daily.   ENTRESTO 24-26 MG Take 1 tablet by mouth 2 (two) times daily.   esomeprazole (NEXIUM) 40 MG capsule Take 40 mg by mouth at bedtime.   ezetimibe  (ZETIA ) 10 MG tablet Take 1 tablet (10 mg total) by mouth daily.   ferrous sulfate 325 (65 FE) MG tablet Take 1 tablet by mouth daily with breakfast.   finasteride  (PROSCAR ) 5 MG tablet Take 5 mg by mouth daily.   folic  acid (FOLVITE) 1 MG tablet Take 1 mg by mouth daily.   hydrocortisone cream 0.5 % Apply 1 Application topically as needed for itching.   ipratropium-albuterol (DUONEB) 0.5-2.5 (3) MG/3ML SOLN Inhale 3 mLs into the lungs every 4 (four) hours as needed (for wheezing and shortness of breath).   metFORMIN  (GLUCOPHAGE -XR) 500 MG 24 hr tablet Take 1 tablet (500 mg total) by mouth daily with breakfast.   metoprolol  succinate (TOPROL -XL) 50 MG 24 hr tablet Take 1 tablet (50 mg total) by mouth at bedtime.   montelukast  (SINGULAIR ) 10 MG tablet Take 10 mg by mouth daily.   Multiple Vitamin (MULTIVITAMIN) tablet Take 1 tablet by mouth daily.   Multiple Vitamins-Minerals (ZINC PO) Take by mouth.   nitroGLYCERIN (NITROSTAT) 0.4 MG SL tablet Place 0.4 mg under the tongue every 5 (five) minutes as needed for chest pain.   spironolactone  (ALDACTONE ) 25 MG tablet Take 1 tablet (25 mg total) by mouth at bedtime.   SYRINGE-NEEDLE, DISP, 3 ML (BD PLASTIPAK SYRINGE) 21G X 1 3 ML MISC USE AS DIRECTED TO INJECT DEPO TESTOSTERONE  EVERY 2 WEEKS   tamsulosin  (FLOMAX ) 0.4 MG CAPS capsule Take 1 capsule (0.4 mg total) by mouth in the morning and at bedtime.   testosterone  cypionate (DEPOTESTOSTERONE CYPIONATE) 200 MG/ML injection Inject 0.5 mLs (100 mg total) into the muscle once a week. Wednesday   torsemide  (DEMADEX ) 10 MG tablet Take 1 tablet (10 mg total) by mouth every other Arnold.   No facility-administered encounter medications on file as of 09/04/2024.    Allergies (verified) Gabapentin, Dilantin [phenytoin], and Morphine   History: Past Medical History:  Diagnosis Date   A-fib (HCC)    Allergy    [place]   Antiphospholipid antibody syndrome    Arthritis    Asthma    Cataract 2014   Removed   CHF (congestive heart failure), NYHA class III, chronic, combined (HCC)    Crohn disease (HCC)    DVT (deep venous thrombosis) (HCC)    GERD (gastroesophageal reflux disease) 2022   Hypertension     Myocardial infarction (HCC) 2002   Seizures (HCC) 1092   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   Stroke (HCC) 1992   Past Surgical History:  Procedure Laterality Date   BACK SURGERY     CARDIAC SURGERY     CORONARY ARTERY BYPASS GRAFT  2001   2001   EYE SURGERY  2014   Caterax replacement   HERNIA REPAIR  2015   umbilical   PACEMAKER INSERTION     SPINE SURGERY  Feb 03, 2023   Releaved tendion on nerve lumbar   TONSILLECTOMY     Family History  Problem Relation Age of Onset   Arthritis Mother    Heart disease Mother 73   Cancer Father 61       colon   Diabetes Sister    Social History   Socioeconomic History   Marital status: Married    Spouse name: Not on  file   Number of children: 7   Years of education: Not on file   Highest education level: Doctorate  Occupational History   Not on file  Tobacco Use   Smoking status: Never   Smokeless tobacco: Never   Tobacco comments:    Never used it  Vaping Use   Vaping status: Never Used  Substance and Sexual Activity   Alcohol use: Never   Drug use: Never   Sexual activity: Yes    Birth control/protection: Other-see comments    Comment: Old age  Other Topics Concern   Not on file  Social History Narrative   [address] 41   [address]   Retired Public affairs consultant-   Social Drivers of Corporate investment banker Strain: Low Risk  (09/04/2024)   Overall Financial Resource Strain (CARDIA)    Difficulty of Paying Living Expenses: Not hard at all  Food Insecurity: No Food Insecurity (09/04/2024)   Hunger Vital Sign    Worried About Running Out of Food in the Last Year: Never true    Ran Out of Food in the Last Year: Never true  Transportation Needs: No Transportation Needs (09/04/2024)   PRAPARE - Administrator, Civil Service (Medical): No    Lack of Transportation (Non-Medical): No  Physical Activity: Inactive (09/04/2024)   Exercise Vital Sign    Days of Exercise per Week: 0 days    Minutes of Exercise per Session: 0 min   Stress: No Stress Concern Present (09/04/2024)   [employer] of Occupational Health - Occupational Stress Questionnaire    Feeling of Stress: Not at all  Social Connections: Socially Integrated (09/04/2024)   Social Connection and Isolation Panel    Frequency of Communication with Friends and Family: More than three times a week    Frequency of Social Gatherings with Friends and Family: More than three times a week    Attends Religious Services: More than 4 times per year    Active Member of [employer] or Organizations: Yes    Attends Banker Meetings: 1 to 4 times per year    Marital Status: Married    Tobacco Counseling Counseling given: Not Answered Tobacco comments: Never used it    Clinical Intake:  Pre-visit preparation completed: Yes  Pain : No/denies pain     BMI - recorded: 28.98 Nutritional Status: BMI 25 -29 Overweight Nutritional Risks: None Diabetes: Yes CBG done?: Yes (98 before breakfast) Did pt. bring in CBG monitor from home?: No  No results found for: HGBA1C   How often do you need to have someone help you when you read instructions, pamphlets, or other written materials from your doctor or pharmacy?: 1 - Never  Interpreter Needed?: No  Information entered by :: Ellouise Haws, LPN   Activities of Daily Living     09/03/2024   10:58 AM  In your present state of health, do you have any difficulty performing the following activities:  Hearing? 0  Vision? 0  Difficulty concentrating or making decisions? 0  Walking or climbing stairs? 1  Comment mobility  Dressing or bathing? 0  Doing errands, shopping? 0  Preparing Food and eating ? N  Using the Toilet? N  In the past six months, have you accidently leaked urine? Y  Comment wears a pad  Do you have problems with loss of bowel control? Y  Managing your Medications? N  Managing your Finances? N  Housekeeping or managing your Housekeeping? N  Patient Care Team: Wendolyn Jenkins Jansky, MD as PCP - General (Family Medicine) Kennyth Chew, MD as PCP - Electrophysiology (Cardiology)  I have updated your Care Teams any recent Medical Services you may have received from other providers in the past year.     Assessment:   This is a routine wellness examination for Brent Arnold.  Hearing/Vision screen Hearing Screening - Comments:: Pt denies any hearing issues  Vision Screening - Comments:: Pt will need to find a new provider    Goals Addressed             This Visit's Progress    Patient Stated       Lose weight and walk with out walker        Depression Screen     09/04/2024    1:14 PM 06/26/2024    1:42 PM 04/20/2023    8:27 AM  PHQ 2/9 Scores  PHQ - 2 Score 0 0 0  PHQ- 9 Score  1     Fall Risk     09/03/2024   10:58 AM 04/20/2023    8:27 AM  Fall Risk   Falls in the past year? 1 0  Number falls in past yr: 1   Injury with Fall? 0   Risk for fall due to : Impaired mobility;Impaired balance/gait;History of fall(s) Impaired balance/gait  Follow up Falls prevention discussed Falls evaluation completed    MEDICARE RISK AT HOME:  Medicare Risk at Home Any stairs in or around the home?: (Patient-Rptd) No If so, are there any without handrails?: (Patient-Rptd) No Home free of loose throw rugs in walkways, pet beds, electrical cords, etc?: (Patient-Rptd) Yes Adequate lighting in your home to reduce risk of falls?: (Patient-Rptd) Yes Life alert?: (Patient-Rptd) Yes Use of a cane, walker or w/c?: (Patient-Rptd) Yes Grab bars in the bathroom?: (Patient-Rptd) Yes Shower chair or bench in shower?: (Patient-Rptd) Yes Elevated toilet seat or a handicapped toilet?: (Patient-Rptd) Yes  TIMED UP AND GO:  Was the test performed?  No  Cognitive Function: 6CIT completed        09/04/2024    1:16 PM  6CIT Screen  What Year? 0 points  What month? 0 points  What time? 0 points  Count back from 20 0 points  Months in reverse 0 points  Repeat phrase 0  points  Total Score 0 points    Immunizations Immunization History  Administered Date(s) Administered   Fluad Quad(high Dose 65+) 10/16/2022   Fluzone Influenza virus vaccine,trivalent (IIV3), split virus 09/18/2010, 09/24/2013, 09/24/2014, 09/02/2015, 09/07/2016   H1N1 11/27/2008   Hepatitis A, Adult 10/21/2017, 04/24/2018   Hepatitis B, PED/ADOLESCENT 06/24/2011   INFLUENZA, HIGH DOSE SEASONAL PF 09/12/2023   Influenza, Quadrivalent, Recombinant, Inj, Pf 08/26/2017, 09/12/2018, 08/31/2019, 10/13/2021   Influenza-Unspecified 11/27/2008, 10/05/2012, 10/08/2021, 10/12/2021, 09/03/2024   Novel Infuenza-h1n1-09 11/27/2008   PFIZER Comirnaty(Gray Top)Covid-19 Tri-Sucrose Vaccine 03/13/2021   PFIZER(Purple Top)SARS-COV-2 Vaccination 01/03/2020, 01/25/2020, 09/12/2020   PNEUMOCOCCAL CONJUGATE-20 06/18/2022   Pfizer Covid-19 Vaccine Bivalent Booster 30yrs & up 12/30/2021   Pneumococcal Conjugate-13 06/11/2014   Pneumococcal Polysaccharide-23 09/13/2011   Respiratory Syncytial Virus Vaccine,Recomb Aduvanted(Arexvy) 10/16/2022   Tdap 11/23/2022   Zoster Recombinant(Shingrix) 04/24/2018   Zoster, Live 12/13/2010    Screening Tests Health Maintenance  Topic Date Due   HEMOGLOBIN A1C  Never done   OPHTHALMOLOGY EXAM  Never done   Diabetic kidney evaluation - Urine ACR  Never done   COVID-19 Vaccine (6 - 2025-26 season) 08/13/2024   Zoster Vaccines-  Shingrix (2 of 2) 12/06/2024 (Originally 06/19/2018)   FOOT EXAM  07/27/2025   Diabetic kidney evaluation - eGFR measurement  08/24/2025   Medicare Annual Wellness (AWV)  09/04/2025   DTaP/Tdap/Td (2 - Td or Tdap) 11/23/2032   Pneumococcal Vaccine: 50+ Years  Completed   Influenza Vaccine  Completed   HPV VACCINES  Aged Out   Meningococcal B Vaccine  Aged Out   Hepatitis B Vaccines 19-59 Average Risk  Discontinued    Health Maintenance Items Addressed: See Nurse Notes at the end of this note  Additional Screening:  Vision Screening:  Recommended annual ophthalmology exams for early detection of glaucoma and other disorders of the eye. Is the patient up to date with their annual eye exam?  No  Who is the provider or what is the name of the office in which the patient attends annual eye exams? Need referral  for ophthalmologist   Dental Screening: Recommended annual dental exams for proper oral hygiene  Community Resource Referral / Chronic Care Management: CRR required this visit?  No   CCM required this visit?  No   Plan:    I have personally reviewed and noted the following in the patient's chart:   Medical and social history Use of alcohol, tobacco or illicit drugs  Current medications and supplements including opioid prescriptions. Patient is not currently taking opioid prescriptions. Functional ability and status Nutritional status Physical activity Advanced directives List of other physicians Hospitalizations, surgeries, and ER visits in previous 12 months Vitals Screenings to include cognitive, depression, and falls Referrals and appointments  In addition, I have reviewed and discussed with patient certain preventive protocols, quality metrics, and best practice recommendations. A written personalized care plan for preventive services as well as general preventive health recommendations were provided to patient.   Ellouise VEAR Haws, LPN   [id number]   After Visit Summary: (MyChart) Due to this being a telephonic visit, the after visit summary with patients personalized plan was offered to patient via MyChart   Notes: Nothing significant to report at this time.

## 2024-09-05 ENCOUNTER — Other Ambulatory Visit (HOSPITAL_BASED_OUTPATIENT_CLINIC_OR_DEPARTMENT_OTHER): Payer: Self-pay

## 2024-09-05 ENCOUNTER — Other Ambulatory Visit: Payer: Self-pay

## 2024-09-05 MED ORDER — TORSEMIDE 10 MG PO TABS
10.0000 mg | ORAL_TABLET | ORAL | 11 refills | Status: AC
  Filled 2024-09-05: qty 30, 60d supply, fill #0

## 2024-09-05 NOTE — Addendum Note (Signed)
 Addended by: JERONA DALTON HERO on: 09/05/2024 11:29 AM   Modules accepted: Orders

## 2024-09-06 ENCOUNTER — Ambulatory Visit (HOSPITAL_COMMUNITY)
Admission: RE | Admit: 2024-09-06 | Discharge: 2024-09-06 | Disposition: A | Discharge status: Home / Self Care | Source: Ambulatory Visit | Attending: Internal Medicine | Admitting: Internal Medicine

## 2024-09-06 ENCOUNTER — Ambulatory Visit (HOSPITAL_COMMUNITY): Payer: Self-pay | Admitting: Cardiology

## 2024-09-06 DIAGNOSIS — I5042 Chronic combined systolic (congestive) and diastolic (congestive) heart failure: Secondary | ICD-10-CM | POA: Insufficient documentation

## 2024-09-06 LAB — BASIC METABOLIC PANEL WITH GFR
Anion gap: 10 (ref 5–15)
BUN: 41 mg/dL — ABNORMAL HIGH (ref 8–23)
CO2: 24 mmol/L (ref 22–32)
Calcium: 8.8 mg/dL — ABNORMAL LOW (ref 8.9–10.3)
Chloride: 101 mmol/L (ref 98–111)
Creatinine, Ser: 1.23 mg/dL (ref 0.61–1.24)
GFR, Estimated: 59 mL/min — ABNORMAL LOW (ref >60)
Glucose, Bld: 101 mg/dL — ABNORMAL HIGH (ref 70–99)
Potassium: 4.8 mmol/L (ref 3.5–5.1)
Sodium: 135 mmol/L (ref 135–145)

## 2024-09-10 ENCOUNTER — Other Ambulatory Visit: Payer: Self-pay | Admitting: Family Medicine

## 2024-09-11 ENCOUNTER — Encounter: Payer: Self-pay | Admitting: Family Medicine

## 2024-09-13 NOTE — Progress Notes (Signed)
 Remote ICD Transmission

## 2024-09-14 ENCOUNTER — Ambulatory Visit (HOSPITAL_BASED_OUTPATIENT_CLINIC_OR_DEPARTMENT_OTHER): Attending: Family Medicine | Admitting: Physical Therapy

## 2024-09-14 ENCOUNTER — Encounter (HOSPITAL_BASED_OUTPATIENT_CLINIC_OR_DEPARTMENT_OTHER): Payer: Self-pay | Admitting: Physical Therapy

## 2024-09-14 ENCOUNTER — Other Ambulatory Visit: Payer: Self-pay

## 2024-09-14 DIAGNOSIS — E299 Testicular dysfunction, unspecified: Secondary | ICD-10-CM | POA: Diagnosis not present

## 2024-09-14 DIAGNOSIS — R293 Abnormal posture: Secondary | ICD-10-CM | POA: Diagnosis not present

## 2024-09-14 DIAGNOSIS — R2689 Other abnormalities of gait and mobility: Secondary | ICD-10-CM | POA: Diagnosis not present

## 2024-09-14 DIAGNOSIS — R269 Unspecified abnormalities of gait and mobility: Secondary | ICD-10-CM | POA: Insufficient documentation

## 2024-09-14 NOTE — Therapy (Signed)
 OUTPATIENT PHYSICAL THERAPY LOWER EXTREMITY EVALUATION   Patient Name: Brent Arnold MRN: [id number] DOB:[birth year], 83 y.o., male Today's Date: 09/14/2024  END OF SESSION:  PT End of Session - 09/14/24 1636     Visit Number 1    Number of Visits 16    Date for Recertification  11/09/24    PT Start Time 1518    PT Stop Time 1600    PT Time Calculation (min) 42 min    Activity Tolerance Patient tolerated treatment well    Behavior During Therapy WFL for tasks assessed/performed          Past Medical History:  Diagnosis Date   A-fib (HCC)    Allergy    Delanton Gapaenton   Antiphospholipid antibody syndrome    Arthritis    Asthma    Cataract 2014   Removed   CHF (congestive heart failure), NYHA class III, chronic, combined (HCC)    Crohn disease (HCC)    DVT (deep venous thrombosis) (HCC)    GERD (gastroesophageal reflux disease) 2022   Hypertension    Myocardial infarction (HCC) 2002   Seizures (HCC) 1092   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   Stroke (HCC) 1992   Past Surgical History:  Procedure Laterality Date   BACK SURGERY     CARDIAC SURGERY     CORONARY ARTERY BYPASS GRAFT  2001   2001   EYE SURGERY  2014   Caterax replacement   HERNIA REPAIR  2015   umbilical   PACEMAKER INSERTION     SPINE SURGERY  Feb 03, 2023   Releaved tendion on nerve lumbar   TONSILLECTOMY     Patient Active Problem List   Diagnosis Date Noted   Gait disturbance 06/27/2024   History of MI (myocardial infarction) 06/27/2024   Diabetes mellitus (HCC) 06/27/2024   Chronic combined systolic and diastolic congestive heart failure (HCC) 06/27/2024   Presence of combination internal cardiac defibrillator (ICD) and pacemaker 06/27/2024   Low testosterone  in male 06/27/2024   Moderate persistent asthma without complication 06/26/2024   Cellulitis of right leg 04/10/2023   Antiphospholipid antibody syndrome 04/10/2023   Hypertension 04/10/2023   Crohn disease (HCC) 04/10/2023   Benign  prostatic hyperplasia with lower urinary tract symptoms 03/31/2023   Hyperlipidemia, unspecified 03/31/2023   Morbid (severe) obesity due to excess calories (HCC) 03/30/2023    PCP: Jenkins Earnie Hamper MD   REFERRING PROVIDER:  Jenkins Earnie Hamper MD   REFERRING DIAG:  Diagnosis  R26.9 (ICD-10-CM) - Gait disturbance    THERAPY DIAG:  Other abnormalities of gait and mobility  Abnormal posture  Rationale for Evaluation and Treatment: Rehabilitation  ONSET DATE:  Increased immobility since February   SUBJECTIVE:   SUBJECTIVE STATEMENT: The patient has had several medical issues over the past year that have led to a decrease in his overall mobility. He has ahd 2 falls. He had an MI early this year and spent 2 months in the hospital. Following he was using a wheelchair for primary mobility. He has worked his way to where he feels like he can use a cane. He feels liek his legs are weak. He also has pain in his lumbar spine when he stands for too long.   PERTINENT HISTORY: Arthritis in thumbs, back, and neck, Stroke 1992, Spine surgery 2024  PAIN:  Are you having pain? Yes: NPRS scale:  Pain location: across the lower throacic area/mid back  Pain description: aching  Aggravating factors: Standing and walking  Relieving  factors:   PRECAUTIONS: Pace maker/ defibrillator   RED FLAGS: None   WEIGHT BEARING RESTRICTIONS: No  FALLS:  Has patient fallen in last 6 months? Yes. Number of falls 2   LIVING ENVIRONMENT: 2 small steps but they have a ramp on them  OCCUPATION:  Retired   Hobbies:  Reading  Writing    PLOF: Independent  PATIENT GOALS:  To be able to walk better  To be ween off the cane if able    NEXT MD VISIT:  February   OBJECTIVE:  Note: Objective measures were completed at Evaluation unless otherwise noted.  DIAGNOSTIC FINDINGS:   PATIENT SURVEYS:  LEFS  Extreme difficulty/unable (0), Quite a bit of difficulty (1), Moderate difficulty (2), Little  difficulty (3), No difficulty (4) Survey date:    Any of your usual work, housework or school activities   2. Usual hobbies, recreational or sporting activities   3. Getting into/out of the bath   4. Walking between rooms   5. Putting on socks/shoes   6. Squatting    7. Lifting an object, like a bag of groceries from the floor   8. Performing light activities around your home   9. Performing heavy activities around your home   10. Getting into/out of a car   11. Walking 2 blocks   12. Walking 1 mile   13. Going up/down 10 stairs (1 flight)   14. Standing for 1 hour   15.  sitting for 1 hour   16. Running on even ground   17. Running on uneven ground   18. Making sharp turns while running fast   19. Hopping    20. Rolling over in bed   Score total:  33/80      COGNITION: Overall cognitive status: Within functional limits for tasks assessed     SENSATION: Has some issues with sensation in his feet 2nd to Crohns disease      POSTURE:  Feels like posture slumps forward with time  PALPATION: Bilateral lumbar paraspinals   LOWER EXTREMITY MMT:  MMT Right eval Left eval  Hip flexion 13.6 18.2  Hip extension    Hip abduction 28.6 37.3  Hip adduction    Hip internal rotation    Hip external rotation    Knee flexion    Knee extension 25.7 40.7  Ankle dorsiflexion    Ankle plantarflexion    Ankle inversion    Ankle eversion     (Blank rows = not tested)    GAIT: Walks with flexed posture Decreased stride length Decreased hip flexion                                                                                                                              TREATMENT DATE:  Manual: Reviewed use of Thera cane for lumbar paraspinal tightness  TherEX: Seated march 3 x 10 Seated hip abduction 3 x 10 Seated long arc quad with band  3 x 10 all with red band   PATIENT EDUCATION:  Education details: HEP, symptom management, Person educated: Patient Education  method: Explanation, Demonstration, Tactile cues, Verbal cues, and Handouts Education comprehension: verbalized understanding, returned demonstration, verbal cues required, tactile cues required, and needs further education  HOME EXERCISE PROGRAM: Access Code: [email or web address] URL: [email or web address] Date: 09/16/2024 Prepared by: Alm Don  Exercises - Seated March with Resistance  - 1 x daily - 7 x weekly - 3 sets - 10 reps - Seated Hip Abduction with Resistance  - 1 x daily - 7 x weekly - 3 sets - 10 reps - Seated Knee Extension with Resistance  - 1 x daily - 7 x weekly - 3 sets - 10 reps - Theracane Over Shoulder  - 1 x daily - 7 x weekly - 3 sets - 10 reps  ASSESSMENT:  CLINICAL IMPRESSION: The patient is a 83 year old male whose had a decline in mobility following several medical issues.  He has been able to progress from a wheelchair to a walker to a cane.  He presents to physical therapy at this time to continue to progress his mobility.  Strength testing showed decreased strength in all muscle was used for ambulation.  He was given a basic program for strengthening and start today.  He also presents with decreased baseline balance.  We will do more extensive balance and mobility test next visit.  We are unable today to secondary to time.  He would benefit from skilled therapy to continue to progress mobility and decrease fall risk. OBJECTIVE IMPAIRMENTS: Abnormal gait, decreased balance, decreased mobility, difficulty walking, decreased ROM, decreased strength, increased muscle spasms, postural dysfunction, and pain.   ACTIVITY LIMITATIONS: carrying, lifting, standing, squatting, stairs, transfers, and locomotion level  PARTICIPATION LIMITATIONS: meal prep, cleaning, shopping, and community activity  PERSONAL FACTORS: MI in February 2025, history of chronic low back pain are also affecting patient's functional outcome.   REHAB POTENTIAL: Good  CLINICAL DECISION  MAKING: Evolving/moderate complexity  EVALUATION COMPLEXITY: Moderate   GOALS: Goals reviewed with patient? Yes  SHORT TERM GOALS: Target date: 10/14/2024   Patient will increase gross bilateral lower extremity strength by 5 pounds Baseline: Goal status: INITIAL  2.  Patient will demonstrate good stability with eyes closed and narrow base of support Baseline:  Goal status: INITIAL  3.  Patient will report a 50% reduction in low back pain when standing Baseline:  Goal status: INITIAL  4.  Patient will be independent with basic HEP for balance Baseline:  Goal status: INITIAL \  LONG TERM GOALS: Target date: 11/11/2024 ]  Patient will ambulate community distances without increased low back pain Baseline:  Goal status: INITIAL  2.  Patient will ambulate without cane if safe Baseline:  Goal status: INITIAL  3.  Patient will be independent with complete HEP Baseline:  Goal status: INITIAL     PLAN:  PT FREQUENCY: 2x/week  PT DURATION: 8 weeks  PLANNED INTERVENTIONS: 97110-Therapeutic exercises, 97530- Therapeutic activity, [id number]- Neuromuscular re-education, 97535- Self Care, [id number]- Manual therapy, [id number]- Gait training, [phone]- Aquatic Therapy, 97014- Electrical stimulation (unattended), 97035- Ultrasound, Patient/Family education, Stair training, Taping, Dry Needling, DME instructions, Cryotherapy, and Moist heat   PLAN FOR NEXT SESSION:  Consider DGI next week.  Also consider 5-minute walk test.  Unable to perform either secondary to time.  Review current HEP progress HEP consider postural upper extremity exercises as well for low back.  Continue to work on exercises to strengthen his legs.  Progress  to standing exercise when tolerated.  Consider progression to gym if tolerated.  Patient may also benefit from water therapy for low back pain.  Alm JINNY Don, PT 09/14/2024, 4:37 PM

## 2024-09-16 ENCOUNTER — Encounter (HOSPITAL_BASED_OUTPATIENT_CLINIC_OR_DEPARTMENT_OTHER): Payer: Self-pay | Admitting: Physical Therapy

## 2024-09-21 ENCOUNTER — Other Ambulatory Visit (HOSPITAL_BASED_OUTPATIENT_CLINIC_OR_DEPARTMENT_OTHER): Payer: Self-pay

## 2024-09-21 ENCOUNTER — Other Ambulatory Visit: Payer: Self-pay

## 2024-09-21 ENCOUNTER — Encounter: Payer: Self-pay | Admitting: Family Medicine

## 2024-09-21 MED ORDER — ESOMEPRAZOLE MAGNESIUM 40 MG PO CPDR
40.0000 mg | DELAYED_RELEASE_CAPSULE | Freq: Every day | ORAL | 0 refills | Status: DC
  Filled 2024-09-21: qty 90, 90d supply, fill #0

## 2024-09-24 ENCOUNTER — Encounter: Payer: Self-pay | Admitting: Family Medicine

## 2024-09-24 ENCOUNTER — Other Ambulatory Visit (HOSPITAL_BASED_OUTPATIENT_CLINIC_OR_DEPARTMENT_OTHER): Payer: Self-pay

## 2024-09-24 ENCOUNTER — Encounter: Payer: Self-pay | Admitting: Internal Medicine

## 2024-09-24 ENCOUNTER — Ambulatory Visit (INDEPENDENT_AMBULATORY_CARE_PROVIDER_SITE_OTHER): Admitting: Internal Medicine

## 2024-09-24 VITALS — BP 110/74 | HR 60 | Temp 97.6°F | Ht 70.0 in | Wt 209.0 lb

## 2024-09-24 DIAGNOSIS — J301 Allergic rhinitis due to pollen: Secondary | ICD-10-CM | POA: Diagnosis not present

## 2024-09-24 DIAGNOSIS — J4540 Moderate persistent asthma, uncomplicated: Secondary | ICD-10-CM

## 2024-09-24 DIAGNOSIS — K50919 Crohn's disease, unspecified, with unspecified complications: Secondary | ICD-10-CM | POA: Diagnosis not present

## 2024-09-24 MED ORDER — FLUTICASONE-SALMETEROL 230-21 MCG/ACT IN AERO
2.0000 | INHALATION_SPRAY | Freq: Two times a day (BID) | RESPIRATORY_TRACT | 12 refills | Status: AC
  Filled 2024-09-24: qty 12, 30d supply, fill #0
  Filled 2024-10-20: qty 12, 30d supply, fill #1
  Filled 2024-11-21: qty 12, 30d supply, fill #2
  Filled 2024-12-20: qty 12, 30d supply, fill #3
  Filled 2025-01-18: qty 12, 30d supply, fill #4

## 2024-09-24 NOTE — Progress Notes (Signed)
 Brent Arnold    [id number]    1941/04/30  Primary Care Physician:Kulik, Jenkins Jansky, MD  Referring Physician: Wendolyn Jenkins Jansky, MD [address] [place],  [place] [address] Reason for Consultation: asthma Date of Consultation: 09/24/2024  Chief complaint:   Chief Complaint  Patient presents with   Consult   Asthma    Establish care with a new pulmonology. History of pneumonia      HPI: Discussed the use of AI scribe software for clinical note transcription with the patient, who gave verbal consent to proceed.  History of Present Illness Brent Arnold is an 83 year old male with asthma and bronchiectasis who presents for re-establishment of pulmonary care after relocating from [place] . He is accompanied by his wife. He was previously under the care of Dr. Les in [place]  for his pulmonary issues.  He has a long-standing history of asthma, first diagnosed in childhood, exacerbated by environmental factors such as pollen and dust. He uses Breo 200 once daily and Combivent as a rescue inhaler, which he uses infrequently, about a couple of times a month. He also uses a vest daily for airway clearance due to bronchiectasis, which causes mucus production. A comprehensive workup including sputum cultures was negative.  He has bronchiectasis and experiences mucus production. He uses a vest daily for airway clearance, which helps in bringing up secretions. He has a nebulizer at home for use during flare-ups.  He was diagnosed with Crohn's disease at the age of 75 and is currently managed with budesonide  capsules. He has not had any surgeries related to Crohn's disease. He was previously on asafetida, which was ineffective.  He experienced a major heart attack on December 28, 2023, resulting in an eight-week ICU stay. He has a history of bypass surgery in 2001 and was unable to have stents placed during the recent heart attack due to the location of the blockage.  He now has an ICD placed.  He has a history of deep vein thrombosis (DVT) in his right leg from 2009, initially thought to be related to antiphospholipid antibody syndrome, but later tests were negative. He is on lifelong Eliquis  for anticoagulation.  He had a stroke in 1994 and has a history of atrial fibrillation, noted during his recent hospitalization.  He has a history of secondhand smoke exposure during childhood from his father. He has never smoked himself.  He has allergies, primarily to pollen and dust mites, and uses Flonase and AllerClear for management. His allergies are currently well-controlled but he anticipates potential issues in the spring.    Social history:  Occupation: retired, Public affairs consultant, Physicist, medical.  Exposures: lives at home with wife. No pets.  Smoking history: never smoker, passive smoke exposure in childhood.   Social History   Occupational History   Not on file  Tobacco Use   Smoking status: Never   Smokeless tobacco: Never   Tobacco comments:    Never used it  Vaping Use   Vaping status: Never Used  Substance and Sexual Activity   Alcohol use: Never   Drug use: Never   Sexual activity: Yes    Birth control/protection: Other-see comments    Comment: Old age    Relevant family history:  Family History  Problem Relation Age of Onset   Arthritis Mother    Heart disease Mother 76   Cancer Father 7       colon   Diabetes Sister  Past Medical History:  Diagnosis Date   A-fib (HCC)    Allergy    Delanton Gapaenton   Antiphospholipid antibody syndrome    Arthritis    Asthma    Cataract 2014   Removed   CHF (congestive heart failure), NYHA class III, chronic, combined (HCC)    Crohn disease (HCC)    DVT (deep venous thrombosis) (HCC)    GERD (gastroesophageal reflux disease) 2022   Hypertension    Myocardial infarction (HCC) 2002   Seizures (HCC) 1092   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 Gen:      No acute distress ENT:  no nasal polyps, mucus membranes moist Lungs:    No increased respiratory effort, symmetric chest wall excursion, clear to auscultation bilaterally, no wheezes or crackles CV:         Regular rate and rhythm; no murmurs, rubs, or gallops.  No pedal edema Abd:      + bowel sounds; soft, non-tender; no distension MSK: no acute synovitis of DIP or PIP joints, no mechanics hands.  Skin:      Warm and dry; no rashes Neuro: normal speech, no focal facial asymmetry Psych: alert and oriented x3, normal mood and affect   Data Reviewed/Medical Decision Making:  Independent interpretation of tests: Imaging: CT Chest  PFTs: I have personally reviewed the patient's PFTs and August 2024 FEV1 2.66  FEV1 % Predicted 89  FVC 3.27  FVC % Predicted 85  TLC 5.36  TLC % Predicted 84  DLCO 29.8  DLCO % Predicted 134  FEV1/FVC 81    Labs:  Lab Results  Component Value Date   WBC 8.6 08/24/2024   HGB 18.6 (H) 08/24/2024   HCT 57.4 (H) 08/24/2024   MCV 92.0 08/24/2024   PLT 192 08/24/2024   Lab Results  Component Value Date   NA 135 09/06/2024   K 4.8 09/06/2024   CO2 24 09/06/2024   GLUCOSE 101 (H) 09/06/2024   BUN 41 (H) 09/06/2024   CREATININE 1.23 09/06/2024   CALCIUM  8.8 (L) 09/06/2024   GFRNONAA 59 (L) 09/06/2024     Immunization status:  Immunization History  Administered Date(s) Administered   Fluad Quad(high Dose 65+) 10/16/2022   Fluzone Influenza virus vaccine,trivalent  (IIV3), split virus 09/18/2010, 09/24/2013, 09/24/2014, 09/02/2015, 09/07/2016   H1N1 11/27/2008   Hepatitis A, Adult 10/21/2017, 04/24/2018   Hepatitis B, PED/ADOLESCENT 06/24/2011   INFLUENZA, HIGH DOSE SEASONAL PF 09/12/2023   Influenza, Quadrivalent, Recombinant, Inj, Pf 08/26/2017, 09/12/2018, 08/31/2019, 10/13/2021   Influenza-Unspecified 11/27/2008, 10/05/2012, 10/08/2021, 10/12/2021, 09/03/2024   Novel Infuenza-h1n1-09 11/27/2008   PFIZER Comirnaty(Gray Top)Covid-19 Tri-Sucrose Vaccine 03/13/2021   PFIZER(Purple Top)SARS-COV-2 Vaccination 01/03/2020, 01/25/2020, 09/12/2020   PNEUMOCOCCAL CONJUGATE-20 06/18/2022   Pfizer Covid-19 Vaccine Bivalent Booster 2yrs & up 12/30/2021   Pfizer(Comirnaty)Fall Seasonal Vaccine 12 years and older 09/03/2024   Pneumococcal  Conjugate-13 06/11/2014   Pneumococcal Polysaccharide-23 09/13/2011   Respiratory Syncytial Virus Vaccine,Recomb Aduvanted(Arexvy) 10/16/2022   Tdap 11/23/2022   Zoster Recombinant(Shingrix) 04/24/2018   Zoster, Live 12/13/2010     I reviewed prior external note(s) from pulmonary in [place]  Dr. Wilnette office, cardiology, primary care  I reviewed the result(s) of the labs and imaging as noted above.   I have ordered pft   Assessment and Plan Assessment & Plan Asthma and bronchiectasis Asthma exacerbated by environmental factors, recent heart attack, and pneumonia. Bronchiectasis with airway thickening and mucus production. Poor asthma control despite normal pulmonary function tests. Elevated eosinophils suggest allergic component. - Switch to Advair, two puffs in the morning and two puffs at night. - Order pulmonary function testing. - Continue using the vest daily for airway clearance. - Use Combivent as needed for rescue therapy. - Consider saline nebulizer treatments if having trouble bringing up secretions. - Monitor response to Advair and consider biologic therapy if symptoms persist.  Allergic  rhinitis Allergic rhinitis triggered by pollen and dust mites, well-controlled with Flonase and AllerClear. - Continue Flonase and AllerClear. - Consider referral to allergist if symptoms worsen.  Crohn's disease Crohn's disease managed with budesonide . Concerns about long-term steroid use and potential liver effects. - Refer to [facility] for gastroenterology care, specifically to Doctor Mansouraty for interventional gastroenterology given history of pancreatic cyst. Patient's son is a radiologist and recommended he see interventional GI.       Return to Care: Return in about 3 months (around 12/25/2024) for Dr. Kara.  Verdon Gore, MD Pulmonary and Critical Care Medicine [facility] Office:[phone]  CC: Wendolyn Jenkins Jansky, MD

## 2024-09-24 NOTE — Patient Instructions (Addendum)
 It was a pleasure to see you today!  Please schedule follow up with Dr Kara in 3 months.  If my schedule is not open yet, we will contact you with a reminder closer to that time. Please call [phone] if you haven't heard from us  a month before, and always call us  sooner if issues or concerns arise. You can also send us  a message through MyChart, but but aware that this is not to be used for urgent issues and it may take up to 5-7 days to receive a reply. Please be aware that you will likely be able to view your results before I have a chance to respond to them. Please give us  5 business days to respond to any non-urgent results.    Before your next visit I would like you to have: Full set of PFTs    -ASTHMA AND BRONCHIECTASIS: Asthma is a condition where your airways narrow and swell, producing extra mucus, which can make breathing difficult. Bronchiectasis is a condition where the airways in your lungs become damaged and widened, leading to mucus build-up. We are switching your medication to Advair, which you should take two puffs in the morning and two puffs at night. Continue using your vest daily for airway clearance and use Combivent as needed for rescue therapy. If you have trouble bringing up secretions, consider using saline nebulizer treatments. We will monitor your response to Advair and may consider biologic therapy if your symptoms persist. We will also order pulmonary function testing to assess your lung function.  -ALLERGIC RHINITIS: Allergic rhinitis is an allergic reaction that causes sneezing, congestion, and a runny nose, often triggered by pollen and dust mites. Your condition is well-controlled with Flonase and AllerClear, so continue using these medications. If your symptoms worsen, we may refer you to an allergist.  -CROHN'S DISEASE: Crohn's disease is a chronic inflammatory bowel disease that affects the lining of your digestive tract. You are currently managing it with  budesonide . 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 Dr. Wilhelmenia is the interventional gastroenterologist at [facility] I referred you to.

## 2024-09-25 ENCOUNTER — Encounter: Payer: Self-pay | Admitting: Cardiology

## 2024-09-25 DIAGNOSIS — J479 Bronchiectasis, uncomplicated: Secondary | ICD-10-CM | POA: Diagnosis not present

## 2024-10-01 ENCOUNTER — Other Ambulatory Visit (HOSPITAL_BASED_OUTPATIENT_CLINIC_OR_DEPARTMENT_OTHER): Payer: Self-pay

## 2024-10-01 ENCOUNTER — Telehealth: Payer: Self-pay | Admitting: Gastroenterology

## 2024-10-01 NOTE — Telephone Encounter (Signed)
 Patient called and stated that he has a referral for his crohn's disease but would also like Dr. Wilhelmenia to look at his pancreas's. Patient stated that he will try to have his records fax over to our practice. Patient was nopt happy with his care with Dr. Rollin and believes his is not the right fit for him. Please advise.

## 2024-10-04 ENCOUNTER — Ambulatory Visit

## 2024-10-04 ENCOUNTER — Other Ambulatory Visit (HOSPITAL_BASED_OUTPATIENT_CLINIC_OR_DEPARTMENT_OTHER): Payer: Self-pay

## 2024-10-04 DIAGNOSIS — I255 Ischemic cardiomyopathy: Secondary | ICD-10-CM | POA: Diagnosis not present

## 2024-10-04 LAB — CUP PACEART REMOTE DEVICE CHECK
Battery Remaining Longevity: 78 mo
Battery Remaining Percentage: 91 %
Brady Statistic RA Percent Paced: 99 %
Brady Statistic RV Percent Paced: 100 %
Date Time Interrogation Session: 20251023000100
HighPow Impedance: 91 Ohm
Implantable Lead Connection Status: 753985
Implantable Lead Connection Status: 753985
Implantable Lead Connection Status: 753985
Implantable Lead Implant Date: 20250128
Implantable Lead Implant Date: 20250128
Implantable Lead Implant Date: 20250128
Implantable Lead Location: 753858
Implantable Lead Location: 753859
Implantable Lead Location: 753860
Implantable Lead Model: 4671
Implantable Lead Model: 673
Implantable Lead Model: 7841
Implantable Lead Serial Number: 1532553
Implantable Lead Serial Number: 257585
Implantable Lead Serial Number: 893656
Implantable Pulse Generator Implant Date: 20250128
Lead Channel Impedance Value: 598 Ohm
Lead Channel Impedance Value: 608 Ohm
Lead Channel Impedance Value: 805 Ohm
Lead Channel Pacing Threshold Amplitude: 0.4 V
Lead Channel Pacing Threshold Amplitude: 2.9 V
Lead Channel Pacing Threshold Pulse Width: 0.4 ms
Lead Channel Pacing Threshold Pulse Width: 1 ms
Lead Channel Setting Pacing Amplitude: 2 V
Lead Channel Setting Pacing Amplitude: 2 V
Lead Channel Setting Pacing Amplitude: 3.8 V
Lead Channel Setting Pacing Pulse Width: 0.4 ms
Lead Channel Setting Pacing Pulse Width: 1 ms
Lead Channel Setting Sensing Sensitivity: 0.5 mV
Lead Channel Setting Sensing Sensitivity: 1 mV
Pulse Gen Serial Number: 352446

## 2024-10-05 ENCOUNTER — Encounter (HOSPITAL_BASED_OUTPATIENT_CLINIC_OR_DEPARTMENT_OTHER): Payer: Self-pay | Admitting: Physical Therapy

## 2024-10-05 ENCOUNTER — Ambulatory Visit (HOSPITAL_BASED_OUTPATIENT_CLINIC_OR_DEPARTMENT_OTHER): Payer: Self-pay | Admitting: Physical Therapy

## 2024-10-05 DIAGNOSIS — E349 Endocrine disorder, unspecified: Secondary | ICD-10-CM | POA: Diagnosis not present

## 2024-10-05 DIAGNOSIS — R293 Abnormal posture: Secondary | ICD-10-CM | POA: Diagnosis not present

## 2024-10-05 DIAGNOSIS — R2689 Other abnormalities of gait and mobility: Secondary | ICD-10-CM

## 2024-10-05 DIAGNOSIS — R269 Unspecified abnormalities of gait and mobility: Secondary | ICD-10-CM | POA: Diagnosis not present

## 2024-10-05 NOTE — Progress Notes (Signed)
 Remote ICD Transmission

## 2024-10-05 NOTE — Therapy (Signed)
 OUTPATIENT PHYSICAL THERAPY LOWER EXTREMITY TREATMENT    Patient Name: Brent Arnold MRN: [id number] DOB:[birth year], 83 y.o., male Today's Date: 10/05/2024  END OF SESSION:  PT End of Session - 10/05/24 1120     Visit Number 2    Number of Visits 16    Date for Recertification  11/09/24    Authorization Type [employer]    Authorization Time Period 09/14/24 to 11/16/24    Authorization - Visit Number 1    Authorization - Number of Visits 20    PT Start Time 1102    PT Stop Time 1142    PT Time Calculation (min) 40 min    Activity Tolerance Patient tolerated treatment well    Behavior During Therapy WFL for tasks assessed/performed           Past Medical History:  Diagnosis Date   A-fib (HCC)    Allergy    Delanton Gapaenton   Antiphospholipid antibody syndrome    Arthritis    Asthma    Cataract 2014   Removed   CHF (congestive heart failure), NYHA class III, chronic, combined (HCC)    Crohn disease (HCC)    DVT (deep venous thrombosis) (HCC)    GERD (gastroesophageal reflux disease) 2022   Hypertension    Myocardial infarction (HCC) 2002   Seizures (HCC) 1092   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   Stroke (HCC) 1992   Past Surgical History:  Procedure Laterality Date   BACK SURGERY     CARDIAC SURGERY     CORONARY ARTERY BYPASS GRAFT  2001   2001   EYE SURGERY  2014   Caterax replacement   HERNIA REPAIR  2015   umbilical   PACEMAKER INSERTION     SPINE SURGERY  Feb 03, 2023   Releaved tendion on nerve lumbar   TONSILLECTOMY     Patient Active Problem List   Diagnosis Date Noted   Gait disturbance 06/27/2024   History of MI (myocardial infarction) 06/27/2024   Diabetes mellitus (HCC) 06/27/2024   Chronic combined systolic and diastolic congestive heart failure (HCC) 06/27/2024   Presence of combination internal cardiac defibrillator (ICD) and pacemaker 06/27/2024   Low testosterone  in male 06/27/2024   Moderate persistent asthma without complication 06/26/2024    Cellulitis of right leg 04/10/2023   Antiphospholipid antibody syndrome 04/10/2023   Hypertension 04/10/2023   Crohn disease (HCC) 04/10/2023   Benign prostatic hyperplasia with lower urinary tract symptoms 03/31/2023   Hyperlipidemia, unspecified 03/31/2023   Morbid (severe) obesity due to excess calories (HCC) 03/30/2023    PCP: Jenkins Earnie Hamper MD   REFERRING PROVIDER:  Jenkins Earnie Hamper MD   REFERRING DIAG:  Diagnosis  R26.9 (ICD-10-CM) - Gait disturbance    THERAPY DIAG:  Other abnormalities of gait and mobility  Abnormal posture  Rationale for Evaluation and Treatment: Rehabilitation  ONSET DATE:  Increased immobility since February   SUBJECTIVE:   SUBJECTIVE STATEMENT:  Nothing new, no pain today.   Per spouse, mostly weak, muscles aren't strong anymore. He hasn't done any of the exercises. We just moved here in July, wants to do exercises in his office but this is not unpacked yet so that's why he hasn't been doing them even though they can be done anywhere in the house    EVAL: The patient has had several medical issues over the past year that have led to a decrease in his overall mobility. He has ahd 2 falls. He had an MI early this year  and spent 2 months in the hospital. Following he was using a wheelchair for primary mobility. He has worked his way to where he feels like he can use a cane. He feels liek his legs are weak. He also has pain in his lumbar spine when he stands for too long.   PERTINENT HISTORY: Arthritis in thumbs, back, and neck, Stroke 1992, Spine surgery 2024  PAIN:  Are you having pain? 0/10 just weak   PRECAUTIONS: Pace maker/ defibrillator   RED FLAGS: None   WEIGHT BEARING RESTRICTIONS: No  FALLS:  Has patient fallen in last 6 months? Yes. Number of falls 2   LIVING ENVIRONMENT: 2 small steps but they have a ramp on them  OCCUPATION:  Retired   Hobbies:  Reading  Writing    PLOF: Independent  PATIENT GOALS:  To be able  to walk better  To be ween off the cane if able    NEXT MD VISIT:  February   OBJECTIVE:  Note: Objective measures were completed at Evaluation unless otherwise noted.  DIAGNOSTIC FINDINGS:   PATIENT SURVEYS:  LEFS  Extreme difficulty/unable (0), Quite a bit of difficulty (1), Moderate difficulty (2), Little difficulty (3), No difficulty (4) Survey date:    Any of your usual work, housework or school activities   2. Usual hobbies, recreational or sporting activities   3. Getting into/out of the bath   4. Walking between rooms   5. Putting on socks/shoes   6. Squatting    7. Lifting an object, like a bag of groceries from the floor   8. Performing light activities around your home   9. Performing heavy activities around your home   10. Getting into/out of a car   11. Walking 2 blocks   12. Walking 1 mile   13. Going up/down 10 stairs (1 flight)   14. Standing for 1 hour   15.  sitting for 1 hour   16. Running on even ground   17. Running on uneven ground   18. Making sharp turns while running fast   19. Hopping    20. Rolling over in bed   Score total:  33/80      COGNITION: Overall cognitive status: Within functional limits for tasks assessed     SENSATION: Has some issues with sensation in his feet 2nd to Crohns disease      POSTURE:  Feels like posture slumps forward with time  PALPATION: Bilateral lumbar paraspinals   LOWER EXTREMITY MMT:  MMT Right eval Left eval  Hip flexion 13.6 18.2  Hip extension    Hip abduction 28.6 37.3  Hip adduction    Hip internal rotation    Hip external rotation    Knee flexion    Knee extension 25.7 40.7  Ankle dorsiflexion    Ankle plantarflexion    Ankle inversion    Ankle eversion     (Blank rows = not tested)    GAIT: Walks with flexed posture Decreased stride length Decreased hip flexion   10/05/24 761ft with 5TT  TREATMENT DATE:   10/05/24  5 minute walk test 772ft with 4WW Nustep seat 11 L5x8 minutes all four extremities    LAQs red TB x10 B HS curls red TB x10 B STS red TB around knees x10 Standing hip ABD x10 B red TB above knees Bridges + ABD into red TB x10 Sidelying clams red TB x10 B Tricep pushups from chair x10    PATIENT EDUCATION:  Education details: HEP, symptom management, Person educated: Patient Education method: Explanation, Demonstration, Tactile cues, Verbal cues, and Handouts Education comprehension: verbalized understanding, returned demonstration, verbal cues required, tactile cues required, and needs further education  HOME EXERCISE PROGRAM: Access Code: [email or web address] URL: [email or web address] Date: 09/16/2024 Prepared by: Alm Don  Exercises - Seated March with Resistance  - 1 x daily - 7 x weekly - 3 sets - 10 reps - Seated Hip Abduction with Resistance  - 1 x daily - 7 x weekly - 3 sets - 10 reps - Seated Knee Extension with Resistance  - 1 x daily - 7 x weekly - 3 sets - 10 reps - Theracane Over Shoulder  - 1 x daily - 7 x weekly - 3 sets - 10 reps  ASSESSMENT:  CLINICAL IMPRESSION:  Arrives today doing well, has not been compliant with HEP. Assessed , then focused primarily on functional activity tolerance and strengthening today. Will plan on potential DGI next visit.    EVAL: The patient is a 83 year old male whose had a decline in mobility following several medical issues.  He has been able to progress from a wheelchair to a walker to a cane.  He presents to physical therapy at this time to continue to progress his mobility.  Strength testing showed decreased strength in all muscle was used for ambulation.  He was given a basic program for strengthening and start today.  He also presents with decreased baseline balance.  We will do more extensive balance and mobility test next visit.   We are unable today to secondary to time.  He would benefit from skilled therapy to continue to progress mobility and decrease fall risk. OBJECTIVE IMPAIRMENTS: Abnormal gait, decreased balance, decreased mobility, difficulty walking, decreased ROM, decreased strength, increased muscle spasms, postural dysfunction, and pain.   ACTIVITY LIMITATIONS: carrying, lifting, standing, squatting, stairs, transfers, and locomotion level  PARTICIPATION LIMITATIONS: meal prep, cleaning, shopping, and community activity  PERSONAL FACTORS: MI in February 2025, history of chronic low back pain are also affecting patient's functional outcome.   REHAB POTENTIAL: Good  CLINICAL DECISION MAKING: Evolving/moderate complexity  EVALUATION COMPLEXITY: Moderate   GOALS: Goals reviewed with patient? Yes  SHORT TERM GOALS: Target date: 10/14/2024   Patient will increase gross bilateral lower extremity strength by 5 pounds Baseline: Goal status: INITIAL  2.  Patient will demonstrate good stability with eyes closed and narrow base of support Baseline:  Goal status: INITIAL  3.  Patient will report a 50% reduction in low back pain when standing Baseline:  Goal status: INITIAL  4.  Patient will be independent with basic HEP for balance Baseline:  Goal status: INITIAL   LONG TERM GOALS: Target date: 11/11/2024 ]  Patient will ambulate community distances without increased low back pain Baseline:  Goal status: INITIAL  2.  Patient will ambulate without cane if safe Baseline:  Goal status: INITIAL  3.  Patient will be independent with complete HEP Baseline:  Goal status: INITIAL     PLAN:  PT FREQUENCY: 2x/week  PT DURATION: 8 weeks  PLANNED INTERVENTIONS: 97110-Therapeutic exercises, 97530- Therapeutic activity, [id number]- Neuromuscular re-education, 97535- Self Care, [id number]- Manual therapy, [id number]- Gait training, [id number]- Aquatic Therapy, 97014- Electrical stimulation (unattended), [phone]-  Ultrasound, Patient/Family education, Stair training, Taping, Dry Needling, DME instructions, Cryotherapy, and Moist heat   PLAN FOR NEXT SESSION:   Consider DGI next week.   Review current HEP progress HEP consider postural upper extremity exercises as well for low back.  Continue to work on exercises to strengthen his legs.  Progress to standing exercise when tolerated.  Consider progression to gym if tolerated.  Patient may also benefit from water therapy for low back pain.    Josette Rough, PT, DPT 10/05/24 11:45 AM

## 2024-10-06 ENCOUNTER — Ambulatory Visit: Payer: Self-pay | Admitting: Cardiology

## 2024-10-12 ENCOUNTER — Encounter (HOSPITAL_BASED_OUTPATIENT_CLINIC_OR_DEPARTMENT_OTHER): Payer: Self-pay | Admitting: Physical Therapy

## 2024-10-12 ENCOUNTER — Ambulatory Visit (HOSPITAL_BASED_OUTPATIENT_CLINIC_OR_DEPARTMENT_OTHER): Payer: Self-pay | Admitting: Physical Therapy

## 2024-10-12 DIAGNOSIS — R293 Abnormal posture: Secondary | ICD-10-CM

## 2024-10-12 DIAGNOSIS — R269 Unspecified abnormalities of gait and mobility: Secondary | ICD-10-CM | POA: Diagnosis not present

## 2024-10-12 DIAGNOSIS — R2689 Other abnormalities of gait and mobility: Secondary | ICD-10-CM

## 2024-10-12 NOTE — Therapy (Addendum)
 OUTPATIENT PHYSICAL THERAPY LOWER EXTREMITY TREATMENT    Patient Name: Brent Arnold MRN: [id number] DOB:Oct 15, 1941, 83 y.o., male Today's Date: 10/12/2024  END OF SESSION:  PT End of Session - 10/12/24 1350     Visit Number 3    Number of Visits 16    Date for Recertification  11/09/24    Authorization Type [employer]    Authorization Time Period 09/14/24 to 11/16/24    Authorization - Visit Number 3    Authorization - Number of Visits 20    PT Start Time 1347    PT Stop Time 1426    PT Time Calculation (min) 39 min    Activity Tolerance Patient tolerated treatment well    Behavior During Therapy WFL for tasks assessed/performed           Past Medical History:  Diagnosis Date   A-fib (HCC)    Allergy    Delanton Gapaenton   Antiphospholipid antibody syndrome    Arthritis    Asthma    Cataract 2014   Removed   CHF (congestive heart failure), NYHA class III, chronic, combined (HCC)    Crohn disease (HCC)    DVT (deep venous thrombosis) (HCC)    GERD (gastroesophageal reflux disease) 2022   Hypertension    Myocardial infarction (HCC) 2002   Seizures (HCC) 1092   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   Stroke (HCC) 1992   Past Surgical History:  Procedure Laterality Date   BACK SURGERY     CARDIAC SURGERY     CORONARY ARTERY BYPASS GRAFT  2001   2001   EYE SURGERY  2014   Caterax replacement   HERNIA REPAIR  2015   umbilical   PACEMAKER INSERTION     SPINE SURGERY  Feb 03, 2023   Releaved tendion on nerve lumbar   TONSILLECTOMY     Patient Active Problem List   Diagnosis Date Noted   Gait disturbance 06/27/2024   History of MI (myocardial infarction) 06/27/2024   Diabetes mellitus (HCC) 06/27/2024   Chronic combined systolic and diastolic congestive heart failure (HCC) 06/27/2024   Presence of combination internal cardiac defibrillator (ICD) and pacemaker 06/27/2024   Low testosterone  in male 06/27/2024   Moderate persistent asthma without complication 06/26/2024    Cellulitis of right leg 04/10/2023   Antiphospholipid antibody syndrome 04/10/2023   Hypertension 04/10/2023   Crohn disease (HCC) 04/10/2023   Benign prostatic hyperplasia with lower urinary tract symptoms 03/31/2023   Hyperlipidemia, unspecified 03/31/2023   Morbid (severe) obesity due to excess calories (HCC) 03/30/2023    PCP: Jenkins Earnie Hamper MD   REFERRING PROVIDER:  Jenkins Earnie Hamper MD   REFERRING DIAG:  Diagnosis  R26.9 (ICD-10-CM) - Gait disturbance    THERAPY DIAG:  Other abnormalities of gait and mobility  Abnormal posture  Rationale for Evaluation and Treatment: Rehabilitation  ONSET DATE:  Increased immobility since February   SUBJECTIVE:   SUBJECTIVE STATEMENT:  Reports he is doing well today.   Per spouse, mostly weak, muscles aren't strong anymore. He hasn't done any of the exercises. We just moved here in July, wants to do exercises in his office but this is not unpacked yet so that's why he hasn't been doing them even though they can be done anywhere in the house    EVAL: The patient has had several medical issues over the past year that have led to a decrease in his overall mobility. He has ahd 2 falls. He had an MI early this  year and spent 2 months in the hospital. Following he was using a wheelchair for primary mobility. He has worked his way to where he feels like he can use a cane. He feels liek his legs are weak. He also has pain in his lumbar spine when he stands for too long.   PERTINENT HISTORY: Arthritis in thumbs, back, and neck, Stroke 1992, Spine surgery 2024  PAIN:  Are you having pain? 0/10 just weak   PRECAUTIONS: Pace maker/ defibrillator   RED FLAGS: None   WEIGHT BEARING RESTRICTIONS: No  FALLS:  Has patient fallen in last 6 months? Yes. Number of falls 2   LIVING ENVIRONMENT: 2 small steps but they have a ramp on them  OCCUPATION:  Retired   Hobbies:  Reading  Writing    PLOF: Independent  PATIENT GOALS:  To be  able to walk better  To be ween off the cane if able    NEXT MD VISIT:  February   OBJECTIVE:  Note: Objective measures were completed at Evaluation unless otherwise noted.  DIAGNOSTIC FINDINGS:   PATIENT SURVEYS:  LEFS  Extreme difficulty/unable (0), Quite a bit of difficulty (1), Moderate difficulty (2), Little difficulty (3), No difficulty (4) Survey date:    Any of your usual work, housework or school activities   2. Usual hobbies, recreational or sporting activities   3. Getting into/out of the bath   4. Walking between rooms   5. Putting on socks/shoes   6. Squatting    7. Lifting an object, like a bag of groceries from the floor   8. Performing light activities around your home   9. Performing heavy activities around your home   10. Getting into/out of a car   11. Walking 2 blocks   12. Walking 1 mile   13. Going up/down 10 stairs (1 flight)   14. Standing for 1 hour   15.  sitting for 1 hour   16. Running on even ground   17. Running on uneven ground   18. Making sharp turns while running fast   19. Hopping    20. Rolling over in bed   Score total:  33/80      COGNITION: Overall cognitive status: Within functional limits for tasks assessed     SENSATION: Has some issues with sensation in his feet 2nd to Crohns disease      POSTURE:  Feels like posture slumps forward with time  PALPATION: Bilateral lumbar paraspinals   LOWER EXTREMITY MMT:  MMT Right eval Left eval  Hip flexion 13.6 18.2  Hip extension    Hip abduction 28.6 37.3  Hip adduction    Hip internal rotation    Hip external rotation    Knee flexion    Knee extension 25.7 40.7  Ankle dorsiflexion    Ankle plantarflexion    Ankle inversion    Ankle eversion     (Blank rows = not tested)    GAIT: Walks with flexed posture Decreased stride length Decreased hip flexion   10/05/24 776ft with 5TT  TREATMENT DATE:  10/12/24 NuStep x6 min lvl 5 Stair taps x10 Stair taps standing on airex pad x10 Standing hip abduction 2x10 bilat Standing hip extension x20 bilat  Seated hip abduction into GTB 2x10 Seated LAQ x10 bilat  Hip adductor squeezes 3x10  10/05/24  5 minute walk test 718ft with 4WW Nustep seat 11 L5x8 minutes all four extremities    LAQs red TB x10 B HS curls red TB x10 B STS red TB around knees x10 Standing hip ABD x10 B red TB above knees Bridges + ABD into red TB x10 Sidelying clams red TB x10 B Tricep pushups from chair x10    PATIENT EDUCATION:  Education details: HEP, symptom management, Person educated: Patient Education method: Explanation, Demonstration, Tactile cues, Verbal cues, and Handouts Education comprehension: verbalized understanding, returned demonstration, verbal cues required, tactile cues required, and needs further education  HOME EXERCISE PROGRAM: Access Code: [email or web address] URL: [email or web address] Date: 09/16/2024 Prepared by: Alm Don  Exercises - Seated March with Resistance  - 1 x daily - 7 x weekly - 3 sets - 10 reps - Seated Hip Abduction with Resistance  - 1 x daily - 7 x weekly - 3 sets - 10 reps - Seated Knee Extension with Resistance  - 1 x daily - 7 x weekly - 3 sets - 10 reps - Theracane Over Shoulder  - 1 x daily - 7 x weekly - 3 sets - 10 reps  ASSESSMENT:  CLINICAL IMPRESSION:  Tolerated exercises well today with no significant fatigue at end of session. Exercises focused on improving gait mechanics and activity tolerance. Verbal cueing to decrease trendelenburg compensation and improve body mechanics. Educated on expected soreness. Will continue to benefit from therapy to address remaining limitations.    EVAL: The patient is a 83 year old male whose had a decline in mobility following several medical issues.  He has been able to progress from a  wheelchair to a [place] to a cane.  He presents to physical therapy at this time to continue to progress his mobility.  Strength testing showed decreased strength in all muscle was used for ambulation.  He was given a basic program for strengthening and start today.  He also presents with decreased baseline balance.  We will do more extensive balance and mobility test next visit.  We are unable today to secondary to time.  He would benefit from skilled therapy to continue to progress mobility and decrease fall risk. OBJECTIVE IMPAIRMENTS: Abnormal gait, decreased balance, decreased mobility, difficulty walking, decreased ROM, decreased strength, increased muscle spasms, postural dysfunction, and pain.   ACTIVITY LIMITATIONS: carrying, lifting, standing, squatting, stairs, transfers, and locomotion level  PARTICIPATION LIMITATIONS: meal prep, cleaning, shopping, and community activity  PERSONAL FACTORS: MI in February 2025, history of chronic low back pain are also affecting patient's functional outcome.   REHAB POTENTIAL: Good  CLINICAL DECISION MAKING: Evolving/moderate complexity  EVALUATION COMPLEXITY: Moderate   GOALS: Goals reviewed with patient? Yes  SHORT TERM GOALS: Target date: 10/14/2024   Patient will increase gross bilateral lower extremity strength by 5 pounds Baseline: Goal status: INITIAL  2.  Patient will demonstrate good stability with eyes closed and narrow base of support Baseline:  Goal status: INITIAL  3.  Patient will report a 50% reduction in low back pain when standing Baseline:  Goal status: INITIAL  4.  Patient will be independent with basic HEP for balance Baseline:  Goal status: INITIAL   LONG TERM GOALS: Target date: 11/11/2024 ]  Patient  will ambulate community distances without increased low back pain Baseline:  Goal status: INITIAL  2.  Patient will ambulate without cane if safe Baseline:  Goal status: INITIAL  3.  Patient will be  independent with complete HEP Baseline:  Goal status: INITIAL PLAN:  PT FREQUENCY: 2x/week  PT DURATION: 8 weeks  PLANNED INTERVENTIONS: 97110-Therapeutic exercises, 97530- Therapeutic activity, [id number]- Neuromuscular re-education, 97535- Self Care, 02859- Manual therapy, [id number]- Gait training, [phone]- Aquatic Therapy, 97014- Electrical stimulation (unattended), 97035- Ultrasound, Patient/Family education, Stair training, Taping, Dry Needling, DME instructions, Cryotherapy, and Moist heat   PLAN FOR NEXT SESSION:   Consider DGI next week.   Review current HEP progress HEP consider postural upper extremity exercises as well for low back.  Continue to work on exercises to strengthen his legs.  Progress to standing exercise when tolerated.  Consider progression to gym if tolerated.  Patient may also benefit from water therapy for low back pain.   Lili Finder, Student-PT 10/12/2024, 1:52 PM  This entire session was performed under direct supervision and direction of a licensed therapist/therapist assistant . I have personally read, edited and approve of the note as written. 2:36 PM, 10/12/24 Prentice CANDIE Stains PT, DPT Physical Therapist at [facility]

## 2024-10-16 ENCOUNTER — Encounter (HOSPITAL_BASED_OUTPATIENT_CLINIC_OR_DEPARTMENT_OTHER): Payer: Self-pay | Admitting: Physical Therapy

## 2024-10-16 ENCOUNTER — Ambulatory Visit (HOSPITAL_BASED_OUTPATIENT_CLINIC_OR_DEPARTMENT_OTHER): Attending: Family Medicine | Admitting: Physical Therapy

## 2024-10-16 DIAGNOSIS — R2689 Other abnormalities of gait and mobility: Secondary | ICD-10-CM | POA: Insufficient documentation

## 2024-10-16 DIAGNOSIS — R293 Abnormal posture: Secondary | ICD-10-CM | POA: Diagnosis not present

## 2024-10-16 NOTE — Therapy (Addendum)
 OUTPATIENT PHYSICAL THERAPY LOWER EXTREMITY TREATMENT    Patient Name: Brent Arnold MRN: [id number] DOB:06-25-1941, 83 y.o., male Today's Date: 10/16/2024  END OF SESSION:  PT End of Session - 10/16/24 1148     Visit Number 4    Number of Visits 16    Date for Recertification  11/09/24    Authorization Type [employer]    Authorization Time Period 09/14/24 to 11/16/24    Authorization - Visit Number 4    Authorization - Number of Visits 20    PT Start Time 1147    PT Stop Time 1229    PT Time Calculation (min) 42 min    Activity Tolerance Patient tolerated treatment well    Behavior During Therapy WFL for tasks assessed/performed           Past Medical History:  Diagnosis Date   A-fib (HCC)    Allergy    [place]   Antiphospholipid antibody syndrome    Arthritis    Asthma    Cataract 2014   Removed   CHF (congestive heart failure), NYHA class III, chronic, combined (HCC)    Crohn disease (HCC)    DVT (deep venous thrombosis) (HCC)    GERD (gastroesophageal reflux disease) 2022   Hypertension    Myocardial infarction (HCC) 2002   Seizures (HCC) 1092   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   Stroke (HCC) 1992   Past Surgical History:  Procedure Laterality Date   BACK SURGERY     CARDIAC SURGERY     CORONARY ARTERY BYPASS GRAFT  2001   2001   EYE SURGERY  2014   Caterax replacement   HERNIA REPAIR  2015   umbilical   PACEMAKER INSERTION     SPINE SURGERY  Feb 03, 2023   Releaved tendion on nerve lumbar   TONSILLECTOMY     Patient Active Problem List   Diagnosis Date Noted   Gait disturbance 06/27/2024   History of MI (myocardial infarction) 06/27/2024   Diabetes mellitus (HCC) 06/27/2024   Chronic combined systolic and diastolic congestive heart failure (HCC) 06/27/2024   Presence of combination internal cardiac defibrillator (ICD) and pacemaker 06/27/2024   Low testosterone  in male 06/27/2024   Moderate persistent asthma without complication 06/26/2024    Cellulitis of right leg 04/10/2023   Antiphospholipid antibody syndrome 04/10/2023   Hypertension 04/10/2023   Crohn disease (HCC) 04/10/2023   Benign prostatic hyperplasia with lower urinary tract symptoms 03/31/2023   Hyperlipidemia, unspecified 03/31/2023   Morbid (severe) obesity due to excess calories (HCC) 03/30/2023    PCP: Jenkins Earnie Hamper MD   REFERRING PROVIDER:  Jenkins Earnie Hamper MD   REFERRING DIAG:  Diagnosis  R26.9 (ICD-10-CM) - Gait disturbance    THERAPY DIAG:  Other abnormalities of gait and mobility  Abnormal posture  Rationale for Evaluation and Treatment: Rehabilitation  ONSET DATE:  Increased immobility since February   SUBJECTIVE:   SUBJECTIVE STATEMENT:  Reports he is doing well today. Not too sore after last session.   Per spouse, mostly weak, muscles aren't strong anymore. He hasn't done any of the exercises. We just moved here in July, wants to do exercises in his office but this is not unpacked yet so that's why he hasn't been doing them even though they can be done anywhere in the house    EVAL: The patient has had several medical issues over the past year that have led to a decrease in his overall mobility. He has ahd 2 falls.  He had an MI early this year and spent 2 months in the hospital. Following he was using a wheelchair for primary mobility. He has worked his way to where he feels like he can use a cane. He feels liek his legs are weak. He also has pain in his lumbar spine when he stands for too long.   PERTINENT HISTORY: Arthritis in thumbs, back, and neck, Stroke 1992, Spine surgery 2024  PAIN:  Are you having pain? 0/10 just weak   PRECAUTIONS: Pace maker/ defibrillator   RED FLAGS: None   WEIGHT BEARING RESTRICTIONS: No  FALLS:  Has patient fallen in last 6 months? Yes. Number of falls 2   LIVING ENVIRONMENT: 2 small steps but they have a ramp on them  OCCUPATION:  Retired   Hobbies:  Reading  Writing    PLOF:  Independent  PATIENT GOALS:  To be able to walk better  To be ween off the cane if able    NEXT MD VISIT:  February   OBJECTIVE:  Note: Objective measures were completed at Evaluation unless otherwise noted.  DIAGNOSTIC FINDINGS:   PATIENT SURVEYS:  LEFS  Extreme difficulty/unable (0), Quite a bit of difficulty (1), Moderate difficulty (2), Little difficulty (3), No difficulty (4) Survey date:    Any of your usual work, housework or school activities   2. Usual hobbies, recreational or sporting activities   3. Getting into/out of the bath   4. Walking between rooms   5. Putting on socks/shoes   6. Squatting    7. Lifting an object, like a bag of groceries from the floor   8. Performing light activities around your home   9. Performing heavy activities around your home   10. Getting into/out of a car   11. Walking 2 blocks   12. Walking 1 mile   13. Going up/down 10 stairs (1 flight)   14. Standing for 1 hour   15.  sitting for 1 hour   16. Running on even ground   17. Running on uneven ground   18. Making sharp turns while running fast   19. Hopping    20. Rolling over in bed   Score total:  33/80      COGNITION: Overall cognitive status: Within functional limits for tasks assessed     SENSATION: Has some issues with sensation in his feet 2nd to Crohns disease      POSTURE:  Feels like posture slumps forward with time  PALPATION: Bilateral lumbar paraspinals   LOWER EXTREMITY MMT:  MMT Right eval Left eval  Hip flexion 13.6 18.2  Hip extension    Hip abduction 28.6 37.3  Hip adduction    Hip internal rotation    Hip external rotation    Knee flexion    Knee extension 25.7 40.7  Ankle dorsiflexion    Ankle plantarflexion    Ankle inversion    Ankle eversion     (Blank rows = not tested)    GAIT: Walks with flexed posture Decreased stride length Decreased hip flexion   10/05/24 724ft with 5TT  TREATMENT DATE:  10/16/24 NuStep x5 min lvl 4 STS x10 from lower table, 2x10 elevated plinth to reduce compensation  Lateral steps with GTB around knees x5 laps  Lateral steps on airex pad x 5 laps with hand assist  Standing on foam pad 3x30 seconds with eyes closed  Step ups 4 inch box 2x10 Ambulated 4 laps in hall  Hip adduction ball squeezes x20   10/12/24 NuStep x6 min lvl 5 Stair taps x10 Stair taps standing on airex pad x10 Standing hip abduction 2x10 bilat Standing hip extension x20 bilat  Seated hip abduction into GTB 2x10 Seated LAQ x10 bilat  Hip adductor squeezes 3x10  10/05/24  5 minute walk test 784ft with 4WW Nustep seat 11 L5x8 minutes all four extremities    LAQs red TB x10 B HS curls red TB x10 B STS red TB around knees x10 Standing hip ABD x10 B red TB above knees Bridges + ABD into red TB x10 Sidelying clams red TB x10 B Tricep pushups from chair x10    PATIENT EDUCATION:  Education details: HEP, symptom management, Person educated: Patient Education method: Explanation, Demonstration, Tactile cues, Verbal cues, and Handouts Education comprehension: verbalized understanding, returned demonstration, verbal cues required, tactile cues required, and needs further education  HOME EXERCISE PROGRAM: Access Code: [email or web address] URL: [email or web address] Date: 09/16/2024 Prepared by: Alm Don  Exercises - Seated March with Resistance  - 1 x daily - 7 x weekly - 3 sets - 10 reps - Seated Hip Abduction with Resistance  - 1 x daily - 7 x weekly - 3 sets - 10 reps - Seated Knee Extension with Resistance  - 1 x daily - 7 x weekly - 3 sets - 10 reps - Theracane Over Shoulder  - 1 x daily - 7 x weekly - 3 sets - 10 reps  ASSESSMENT:  CLINICAL IMPRESSION:  Tolerated exercises well today with no significant fatigue at end of session. Exercises focused  on improving gait mechanics and activity tolerance. Verbal cueing to decrease trendelenburg compensation and widening of BOS during gait. Patient demonstrates increased trunk flexion throughout gait due to weak glutes and decreased activity tolerance. Will continue to benefit from therapy to address remaining limitations.    EVAL: The patient is a 83 year old male whose had a decline in mobility following several medical issues.  He has been able to progress from a wheelchair to a Kohana Amble to a cane.  He presents to physical therapy at this time to continue to progress his mobility.  Strength testing showed decreased strength in all muscle was used for ambulation.  He was given a basic program for strengthening and start today.  He also presents with decreased baseline balance.  We will do more extensive balance and mobility test next visit.  We are unable today to secondary to time.  He would benefit from skilled therapy to continue to progress mobility and decrease fall risk. OBJECTIVE IMPAIRMENTS: Abnormal gait, decreased balance, decreased mobility, difficulty walking, decreased ROM, decreased strength, increased muscle spasms, postural dysfunction, and pain.   ACTIVITY LIMITATIONS: carrying, lifting, standing, squatting, stairs, transfers, and locomotion level  PARTICIPATION LIMITATIONS: meal prep, cleaning, shopping, and community activity  PERSONAL FACTORS: MI in February 2025, history of chronic low back pain are also affecting patient's functional outcome.   REHAB POTENTIAL: Good  CLINICAL DECISION MAKING: Evolving/moderate complexity  EVALUATION COMPLEXITY: Moderate   GOALS: Goals reviewed with patient? Yes  SHORT TERM GOALS: Target date: 10/14/2024   Patient will increase  gross bilateral lower extremity strength by 5 pounds Baseline: Goal status: INITIAL  2.  Patient will demonstrate good stability with eyes closed and narrow base of support Baseline:  Goal status:  INITIAL  3.  Patient will report a 50% reduction in low back pain when standing Baseline:  Goal status: INITIAL  4.  Patient will be independent with basic HEP for balance Baseline:  Goal status: INITIAL   LONG TERM GOALS: Target date: 11/11/2024 ]  Patient will ambulate community distances without increased low back pain Baseline:  Goal status: INITIAL  2.  Patient will ambulate without cane if safe Baseline:  Goal status: INITIAL  3.  Patient will be independent with complete HEP Baseline:  Goal status: INITIAL PLAN:  PT FREQUENCY: 2x/week  PT DURATION: 8 weeks  PLANNED INTERVENTIONS: 97110-Therapeutic exercises, 97530- Therapeutic activity, [id number]- Neuromuscular re-education, 97535- Self Care, [id number]- Manual therapy, [id number]- Gait training, [phone]- Aquatic Therapy, 97014- Electrical stimulation (unattended), 97035- Ultrasound, Patient/Family education, Stair training, Taping, Dry Needling, DME instructions, Cryotherapy, and Moist heat   PLAN FOR NEXT SESSION:   Consider DGI next week.   Review current HEP progress HEP consider postural upper extremity exercises as well for low back.  Continue to work on exercises to strengthen his legs.  Progress to standing exercise when tolerated.  Consider progression to gym if tolerated.  Patient may also benefit from water therapy for low back pain.   Lili Finder, Student-PT 10/16/2024, 12:30 PM  This entire session was performed under direct supervision and direction of a licensed therapist/therapist assistant . I have personally read, edited and approve of the note as 12:38 PM, 10/16/24 Prentice CANDIE Stains PT, DPT Physical Therapist at [facility]

## 2024-10-22 ENCOUNTER — Other Ambulatory Visit (HOSPITAL_BASED_OUTPATIENT_CLINIC_OR_DEPARTMENT_OTHER): Payer: Self-pay

## 2024-10-22 ENCOUNTER — Other Ambulatory Visit: Payer: Self-pay

## 2024-10-23 ENCOUNTER — Ambulatory Visit (HOSPITAL_BASED_OUTPATIENT_CLINIC_OR_DEPARTMENT_OTHER): Admitting: Physical Therapy

## 2024-10-24 ENCOUNTER — Encounter: Payer: Self-pay | Admitting: Family Medicine

## 2024-10-26 ENCOUNTER — Encounter (HOSPITAL_BASED_OUTPATIENT_CLINIC_OR_DEPARTMENT_OTHER): Admitting: Physical Therapy

## 2024-10-29 ENCOUNTER — Encounter: Payer: Self-pay | Admitting: Family Medicine

## 2024-10-29 ENCOUNTER — Other Ambulatory Visit: Payer: Self-pay

## 2024-10-29 DIAGNOSIS — E1159 Type 2 diabetes mellitus with other circulatory complications: Secondary | ICD-10-CM

## 2024-10-29 MED ORDER — ACCU-CHEK SOFTCLIX LANCETS MISC: 1.0000 | Freq: Every day | Status: AC

## 2024-10-30 ENCOUNTER — Encounter (HOSPITAL_BASED_OUTPATIENT_CLINIC_OR_DEPARTMENT_OTHER): Admitting: Physical Therapy

## 2024-11-02 ENCOUNTER — Encounter (HOSPITAL_BASED_OUTPATIENT_CLINIC_OR_DEPARTMENT_OTHER): Admitting: Physical Therapy

## 2024-11-06 ENCOUNTER — Encounter (HOSPITAL_BASED_OUTPATIENT_CLINIC_OR_DEPARTMENT_OTHER): Admitting: Physical Therapy

## 2024-11-09 ENCOUNTER — Other Ambulatory Visit (HOSPITAL_BASED_OUTPATIENT_CLINIC_OR_DEPARTMENT_OTHER): Payer: Self-pay

## 2024-11-13 ENCOUNTER — Encounter (HOSPITAL_BASED_OUTPATIENT_CLINIC_OR_DEPARTMENT_OTHER): Admitting: Physical Therapy

## 2024-11-13 ENCOUNTER — Other Ambulatory Visit (HOSPITAL_BASED_OUTPATIENT_CLINIC_OR_DEPARTMENT_OTHER): Payer: Self-pay

## 2024-11-15 ENCOUNTER — Encounter (HOSPITAL_BASED_OUTPATIENT_CLINIC_OR_DEPARTMENT_OTHER): Admitting: Physical Therapy

## 2024-11-16 ENCOUNTER — Other Ambulatory Visit: Payer: Self-pay | Admitting: Family Medicine

## 2024-11-16 ENCOUNTER — Encounter (HOSPITAL_BASED_OUTPATIENT_CLINIC_OR_DEPARTMENT_OTHER): Admitting: Physical Therapy

## 2024-11-19 NOTE — Progress Notes (Signed)
 PCP: Wendolyn Jenkins Jansky, MD HF Cardiology: Dr. Rolan  Chief complaint: CHF  83 y.o. with history of CAD s/p CABG, ischemic cardiomyopathy, CVA, DVT, and paroxysmal atrial fibrillation was referred by Dr. Wendolyn for evaluation of CHF and CAD. Patient had CABG in 2001 in [place]  with LIMA-LAD, free RIMA-dRCA, sequential SVG-D1 and OM3. Patient was admitted in 1/25 in [place]  with CHF.  Echo showed EF 36%, mild LVH, PASP 31 mmHg, aortic sclerosis without significant stenosis. LHC 1/25 showed patent LIMA-LAD, free RIMA-dRCA.  Sequential SVG-D1 and OM3 with occluded branch to OM3 and 50% stenosis of branch to D1. 99% LM, 90% heavily calcified proximal LCx.  Small LCx territory, decided against PCI LM-LCx given extensive disease and small territory supplied. Patient had episodes of complete heart block this hospitalization and [employer] CRT-D device was placed.  Followup echo in 4/25 showed EF 44%, normal RV.   Echo in 8/25 showed EF 50-55%, normal RV, normal IVC.   Patient returns for followup of CHF.  Weight is up a few pounds.  He has some generalized fatigue but no exertional dyspnea or chest pain.  No orthopnea/PND.  No palpitations.  No lightheadedness. He walks with a cane for balance.    ECG (personally reviewed): A-BiV pacing  [employer] device interrogation: 100% BiV pacing, 0% AF, HeartLogic 1  Labs (6/25): K 4.8, creatinine 1.08, LFTs normal Labs (8/25): Transferrin saturation 31%, LDL 65, TSH normal, BNP 240, K 4.8, creatinine 1.12, LFTs normal  PMH: 1. CAD: S/p CABG 2001 with LIMA-LAD, free RIMA-dRCA, sequential SVG-D1 and OM3.  - LHC 1/25 showed patent LIMA-LAD, free RIMA-dRCA.  Sequential SVG-D1 and OM3 with occluded branch to OM3 and 50% stenosis of branch to D1. 99% LM, 90% heavily calcified proximal LCx.  Small LCx territory, decided against PCI LM-LCx given extensive disease and small territory supplied.  2. Chronic systolic CHF: Ischemic cardiomyopathy.  [employer] CRT-D device.  - Echo (1/25): EF 36%, mild LVH, PASP 31 mmHg, aortic sclerosis without significant stenosis.   - Echo (4/25): EF 44%, normal RV.  - Echo (8/25): EF 50-55%, normal RV, normal IVC.  3. CVA 4. 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  5. Atrial fibrillation: Paroxysmal 6. Asthma/bronchiectasis 7. Crohns disease 8. Benign pancreatic cyst 9. Complete heart block: [employer] CRT-D.   SH: Married, from [place] , moved to [place] to live near daughter.  Nonsmoker, no ETOH.   Family History  Problem Relation Age of Onset   Arthritis Mother    Heart disease Mother 28   Cancer Father 76       colon   Diabetes Sister    ROS: All systems reviewed and negative except as per HPI.   Current Outpatient Medications  Medication Sig Dispense Refill   Accu-Chek Softclix Lancets lancets 1 each by Other route daily.     acetaminophen  (TYLENOL ) 650 MG CR tablet Take 650 mg by mouth 2 (two) times daily.     amiodarone  (PACERONE ) 200 MG tablet Take 1 tablet (200 mg total) by mouth daily. 90 tablet 3   apixaban  (ELIQUIS ) 5 MG TABS tablet Take 1 tablet (5 mg total) by mouth 2 (two) times daily. 60 tablet 11   Ascorbic Acid (VITAMIN C PO) Take by mouth.     aspirin 81 MG chewable tablet Chew 1 tablet by mouth daily.     atorvastatin  (LIPITOR) 80 MG tablet Take 1 tablet (80 mg total) by mouth daily. 90 tablet 3   beta carotene 15 MG capsule Take  15 mg by mouth daily.     Blood Glucose Monitoring Suppl (ACCU-CHEK GUIDE ME) w/Device KIT See admin instructions.     budesonide  (ENTOCORT EC ) 3 MG 24 hr capsule TAKE 1 CAPSULE (3 MG TOTAL) BY MOUTH IN THE MORNING AND AT BEDTIME. 180 capsule 3   calcium  carbonate (OSCAL) 1500 (600 Ca) MG TABS tablet Take 600 mg by mouth daily with breakfast.     cyanocobalamin  (VITAMIN B12) 1000 MCG/ML injection Inject 1 mL (1,000 mcg total) into the muscle once a week. 12 mL 3   empagliflozin  (JARDIANCE ) 10 MG TABS tablet Take 1 tablet (10 mg total) by mouth  daily. 30 tablet 11   ENTRESTO 24-26 MG Take 1 tablet by mouth 2 (two) times daily.     esomeprazole  (NEXIUM ) 40 MG capsule Take 1 capsule (40 mg total) by mouth at bedtime. 90 capsule 0   ezetimibe  (ZETIA ) 10 MG tablet Take 1 tablet (10 mg total) by mouth daily. 90 tablet 3   ferrous sulfate 325 (65 FE) MG tablet Take 1 tablet by mouth daily with breakfast.     finasteride  (PROSCAR ) 5 MG tablet Take 5 mg by mouth daily.     fluticasone -salmeterol (ADVAIR  HFA) 230-21 MCG/ACT inhaler Inhale 2 puffs into the lungs 2 (two) times daily. 12 g 12   folic acid (FOLVITE) 1 MG tablet Take 1 mg by mouth daily.     hydrocortisone cream 0.5 % Apply 1 Application topically as needed for itching.     ipratropium-albuterol (DUONEB) 0.5-2.5 (3) MG/3ML SOLN Inhale 3 mLs into the lungs every 4 (four) hours as needed (for wheezing and shortness of breath).     magnesium  30 MG tablet Take 30 mg by mouth 2 (two) times daily. (Patient taking differently: Take 30 mg by mouth 2 (two) times daily. Taking 400mg  twice a day)     metFORMIN  (GLUCOPHAGE -XR) 500 MG 24 hr tablet Take 1 tablet (500 mg total) by mouth daily with breakfast. 90 tablet 1   metoprolol  succinate (TOPROL -XL) 50 MG 24 hr tablet Take 1 tablet (50 mg total) by mouth at bedtime. 90 tablet 3   montelukast  (SINGULAIR ) 10 MG tablet Take 10 mg by mouth daily.     Multiple Vitamin (MULTIVITAMIN) tablet Take 1 tablet by mouth daily.     Multiple Vitamins-Minerals (ZINC PO) Take by mouth.     nitroGLYCERIN (NITROSTAT) 0.4 MG SL tablet Place 0.4 mg under the tongue every 5 (five) minutes as needed for chest pain.     spironolactone  (ALDACTONE ) 25 MG tablet Take 1 tablet (25 mg total) by mouth at bedtime.     SYRINGE-NEEDLE, DISP, 3 ML (BD PLASTIPAK SYRINGE) 21G X 1 3 ML MISC USE AS DIRECTED TO INJECT DEPO TESTOSTERONE  EVERY 2 WEEKS     tamsulosin  (FLOMAX ) 0.4 MG CAPS capsule TAKE 1 CAPSULE (0.4 MG TOTAL) IN THE MORNING AND AT BEDTIME. 180 capsule 3   testosterone   cypionate (DEPOTESTOSTERONE CYPIONATE) 200 MG/ML injection Inject 0.5 mLs (100 mg total) into the muscle once a week. Wednesday 6 mL 1   torsemide  (DEMADEX ) 10 MG tablet Take 1 tablet (10 mg total) by mouth every other day. 30 tablet 11   No current facility-administered medications for this visit.   There were no vitals taken for this visit. General: NAD Neck: No JVD, no thyromegaly or thyroid  nodule.  Lungs: Clear to auscultation bilaterally with normal respiratory effort. CV: Nondisplaced PMI.  Heart regular S1/S2, no S3/S4, no murmur.  No peripheral edema.  No carotid bruit.  Normal pedal pulses.  Abdomen: Soft, nontender, no hepatosplenomegaly, no distention.  Skin: Intact without lesions or rashes.  Neurologic: Alert and oriented x 3.  Psych: Normal affect. Extremities: No clubbing or cyanosis.  HEENT: Normal.   Assessment/Plan: 1. Chronic systolic CHF: Ischemic cardiomyopathy.  Echo in 1/25 with EF 36%, mild LVH.  Echo in 4/25 with EF 44%.  He has a [employer] CRT-D device.  Echo in 8/25 showed EF 50-55%, normal RV, normal IVC.  He is not volume overloaded by exam or HeartLogic.  NYHA class II.  - Continue Jardiance  10 mg daily.  - Continue spironolactone  25 mg daily.  - Continue Toprol  XL 25 mg daily.   - Continue Entresto 24/26 bid.  - Continue torsemide  10 mg daily, BMET/BNP today.  2. CAD: S/p CABG 2001.  On cath in 1/25, he had unrevascularized disease in the LCx system, but he has had no recent chest pain, and the LCx territory is small and extensively diseased so decision made to medically manage.  No chest pain.  - Continue ASA 81 - Continue Atorvastatin  80 mg daily and Zetia , goal LDL < 55.  Check lipids today.   3. Atrial fibrillation: Paroxysmal. He is in NSR and denies palpitations.  - Continue amiodarone  200 mg daily to maintain NSR.  Check LFTs today, recent TSH normal, he will need a regular eye exam.  - Continue apixaban .   4. H/o DVT: Not triggered.   -  Continue apixaban .  5. Complete heart block: [employer] CRT-D device.   Followup in 2 months.   I spent 31 minutes reviewing records, interviewing/examining patient, and managing orders.    Harlene HERO [facility] 11/19/2024

## 2024-11-20 ENCOUNTER — Telehealth: Payer: Self-pay | Admitting: Gastroenterology

## 2024-11-20 ENCOUNTER — Ambulatory Visit (HOSPITAL_BASED_OUTPATIENT_CLINIC_OR_DEPARTMENT_OTHER): Admitting: Physical Therapy

## 2024-11-20 NOTE — Telephone Encounter (Signed)
 Good Afternoon Dr Wilhelmenia   Patient preferred provider.  Patient requesting to transfer care for chromes. Records available for review in epic. Please review and advise on scheduling.   Thank you

## 2024-11-21 ENCOUNTER — Other Ambulatory Visit (HOSPITAL_BASED_OUTPATIENT_CLINIC_OR_DEPARTMENT_OTHER): Payer: Self-pay

## 2024-11-21 NOTE — Telephone Encounter (Signed)
 AMK, Thank you for helping me find this. Our CA19-9 labs are going to done with a different modality of laboratory, so following this may be difficult here and thus we need to get a new sense of a baseline here. If I am going to monitor him with you longer term, I would recommend that he have a CA19-9 performed at the [facility] lab. I would also recommend that a 67-month MRI/MRCP be performed to get our baseline here. If you are in agreement and you feel the patient would be amenable to this, then let me know and then we can send this information for orders and such.  It will be a few weeks or months to get him in to see me, but we could agree to start with this plan of action. Please let me know. Thanks. GM

## 2024-11-21 NOTE — Telephone Encounter (Addendum)
 Patient transfer request is reasonable. I need to see any updated Dr. Rollin records. Can set him up for a clinic visit with me not an APP (new patient. What I cannot see is the actual CA 19-9 levels been drawn in the last 2 years at they are not documented in the chart completely but rather as saying he is CA 19-9 is elevated. If he can work on trying to get the actual CA 19-9 levels faxed to us  for record review that would be most ideal. Thankfully he had his MRI done in June 2025 and the reported cystic lesions were 10 mm or smaller, without pancreatic ductal dilation. Until seen in clinic I cannot give any further recommendations at this time, though PCP can reach out to me if thoughts if necessary until then.  GM

## 2024-11-22 ENCOUNTER — Ambulatory Visit (HOSPITAL_BASED_OUTPATIENT_CLINIC_OR_DEPARTMENT_OTHER): Attending: Family Medicine | Admitting: Physical Therapy

## 2024-11-22 ENCOUNTER — Telehealth (HOSPITAL_COMMUNITY): Payer: Self-pay

## 2024-11-22 DIAGNOSIS — R2689 Other abnormalities of gait and mobility: Secondary | ICD-10-CM | POA: Insufficient documentation

## 2024-11-22 DIAGNOSIS — R293 Abnormal posture: Secondary | ICD-10-CM | POA: Diagnosis present

## 2024-11-22 NOTE — Therapy (Signed)
 OUTPATIENT PHYSICAL THERAPY LOWER EXTREMITY TREATMENT    Patient Name: Brent Arnold MRN: [id number] DOB:21-Aug-1941, 83 y.o., male Today's Date: 11/23/2024  END OF SESSION:  PT End of Session - 11/23/24 1054     Visit Number 5    Number of Visits 16    Date for Recertification  01/18/25    Authorization Type [employer]    Authorization Time Period 09/14/24 to 11/16/24    Authorization - Number of Visits 20    Activity Tolerance Patient tolerated treatment well    Behavior During Therapy [facility] for tasks assessed/performed            Past Medical History:  Diagnosis Date   A-fib (HCC)    Allergy    [place]   Antiphospholipid antibody syndrome    Arthritis    Asthma    Cataract 2014   Removed   CHF (congestive heart failure), NYHA class III, chronic, combined (HCC)    Crohn disease (HCC)    DVT (deep venous thrombosis) (HCC)    GERD (gastroesophageal reflux disease) 2022   Hypertension    Myocardial infarction (HCC) 2002   Seizures (HCC) 1092   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   Stroke (HCC) 1992   Past Surgical History:  Procedure Laterality Date   BACK SURGERY     CARDIAC SURGERY     CORONARY ARTERY BYPASS GRAFT  2001   2001   EYE SURGERY  2014   Caterax replacement   HERNIA REPAIR  2015   umbilical   PACEMAKER INSERTION     SPINE SURGERY  Feb 03, 2023   Releaved tendion on nerve lumbar   TONSILLECTOMY     Patient Active Problem List   Diagnosis Date Noted   Gait disturbance 06/27/2024   History of MI (myocardial infarction) 06/27/2024   Diabetes mellitus (HCC) 06/27/2024   Chronic combined systolic and diastolic congestive heart failure (HCC) 06/27/2024   Presence of combination internal cardiac defibrillator (ICD) and pacemaker 06/27/2024   Low testosterone  in male 06/27/2024   Moderate persistent asthma without complication 06/26/2024   Cellulitis of right leg 04/10/2023   Antiphospholipid antibody syndrome 04/10/2023   Hypertension 04/10/2023    Crohn disease (HCC) 04/10/2023   Benign prostatic hyperplasia with lower urinary tract symptoms 03/31/2023   Hyperlipidemia, unspecified 03/31/2023   Morbid (severe) obesity due to excess calories (HCC) 03/30/2023  Progress Note Reporting Period 10/03 to 12/11  See note below for Objective Data and Assessment of Progress/Goals.      PCP: Jenkins Earnie Hamper MD   REFERRING PROVIDER:  Jenkins Earnie Hamper MD   REFERRING DIAG:  Diagnosis  R26.9 (ICD-10-CM) - Gait disturbance    THERAPY DIAG:  No diagnosis found.  Rationale for Evaluation and Treatment: Rehabilitation  ONSET DATE:  Increased immobility since February   SUBJECTIVE:   SUBJECTIVE STATEMENT: The patient reports that overall his exercises have been going well. He has not been able to come to PT as much as he had wanted because he has not been able to drive. He will be able to drive in January. He reports overall he is moving a little better.       Per spouse, mostly weak, muscles aren't strong anymore. He hasn't done any of the exercises. We just moved here in July, wants to do exercises in his office but this is not unpacked yet so that's why he hasn't been doing them even though they can be done anywhere in the house  EVAL: The patient has had several medical issues over the past year that have led to a decrease in his overall mobility. He has ahd 2 falls. He had an MI early this year and spent 2 months in the hospital. Following he was using a wheelchair for primary mobility. He has worked his way to where he feels like he can use a cane. He feels liek his legs are weak. He also has pain in his lumbar spine when he stands for too long.   PERTINENT HISTORY: Arthritis in thumbs, back, and neck, Stroke 1992, Spine surgery 2024  PAIN:  Are you having pain? 0/10 just weak   PRECAUTIONS: Pace maker/ defibrillator   RED FLAGS: None   WEIGHT BEARING RESTRICTIONS: No  FALLS:  Has patient fallen in last 6 months?  Yes. Number of falls 2   LIVING ENVIRONMENT: 2 small steps but they have a ramp on them  OCCUPATION:  Retired   Hobbies:  Reading  Writing    PLOF: Independent  PATIENT GOALS:  To be able to walk better  To be ween off the cane if able    NEXT MD VISIT:  February   OBJECTIVE:  Note: Objective measures were completed at Evaluation unless otherwise noted.  DIAGNOSTIC FINDINGS:   PATIENT SURVEYS:  LEFS  Extreme difficulty/unable (0), Quite a bit of difficulty (1), Moderate difficulty (2), Little difficulty (3), No difficulty (4) Survey date:     Any of your usual work, housework or school activities    2. Usual hobbies, recreational or sporting activities    3. Getting into/out of the bath    4. Walking between rooms    5. Putting on socks/shoes    6. Squatting     7. Lifting an object, like a bag of groceries from the floor    8. Performing light activities around your home    9. Performing heavy activities around your home    10. Getting into/out of a car    11. Walking 2 blocks    12. Walking 1 mile    13. Going up/down 10 stairs (1 flight)    14. Standing for 1 hour    15.  sitting for 1 hour    16. Running on even ground    17. Running on uneven ground    18. Making sharp turns while running fast    19. Hopping     20. Rolling over in bed    Score total:  33/80  38/80     COGNITION: Overall cognitive status: Within functional limits for tasks assessed     SENSATION: Has some issues with sensation in his feet 2nd to Crohns disease   BERG Balance Test          Date:   Sit to Stand 3  Standing unsupported 4  Sitting with back unsupported but feet supported 4  Stand to sit  3  Transfers  2  Standing unsupported with eyes closed 3  Standing unsupported feet together 3  From standing position, reach forward with outstretched arm 2  From standing position, pick up object from floor 2  From standing position, turn and look behind over each shoulder 2   Turn 360 2  Standing unsupported, alternately place foot on step 1  Standing unsupported, one foot in front 0  Standing on one leg 0  Total:  31      POSTURE:  Feels like posture slumps forward with time  PALPATION: Bilateral  lumbar paraspinals   LOWER EXTREMITY MMT:  MMT Right eval Left eval Right  12/12 Left 12/12  Hip flexion 13.6 18.2 33.9 35.3  Hip extension      Hip abduction 28.6 37.3 38.2 47.6  Hip adduction      Hip internal rotation      Hip external rotation      Knee flexion      Knee extension 25.7 40.7 34.2 50.0  Ankle dorsiflexion      Ankle plantarflexion      Ankle inversion      Ankle eversion       (Blank rows = not tested)    GAIT: Walks with flexed posture Decreased stride length Decreased hip flexion   10/05/24 744ft with 5TT   Balance:  Narrow base eyes open                                                                                                                                TREATMENT DATE:  There-ex:  Hip abduction 2x15 blue  Seated march 2 x 15 blue  10/16/24 NuStep x5 min lvl 4 STS x10 from lower table, 2x10 elevated plinth to reduce compensation  Lateral steps with GTB around knees x5 laps  Lateral steps on airex pad x 5 laps with hand assist  Standing on foam pad 3x30 seconds with eyes closed  Step ups 4 inch box 2x10 Ambulated 4 laps in hall  Hip adduction ball squeezes x20   10/12/24 NuStep x6 min lvl 5 Stair taps x10 Stair taps standing on airex pad x10 Standing hip abduction 2x10 bilat Standing hip extension x20 bilat  Seated hip abduction into GTB 2x10 Seated LAQ x10 bilat  Hip adductor squeezes 3x10  10/05/24  5 minute walk test 775ft with 4WW Nustep seat 11 L5x8 minutes all four extremities    LAQs red TB x10 B HS curls red TB x10 B STS red TB around knees x10 Standing hip ABD x10 B red TB above knees Bridges + ABD into red TB x10 Sidelying clams red TB x10 B Tricep pushups from  chair x10    PATIENT EDUCATION:  Education details: HEP, symptom management, Person educated: Patient Education method: Explanation, Demonstration, Tactile cues, Verbal cues, and Handouts Education comprehension: verbalized understanding, returned demonstration, verbal cues required, tactile cues required, and needs further education  HOME EXERCISE PROGRAM: Access Code: [email or web address] URL: [email or web address] Date: 09/16/2024 Prepared by: Alm Don  Exercises - Seated March with Resistance  - 1 x daily - 7 x weekly - 3 sets - 10 reps - Seated Hip Abduction with Resistance  - 1 x daily - 7 x weekly - 3 sets - 10 reps - Seated Knee Extension with Resistance  - 1 x daily - 7 x weekly - 3 sets - 10 reps - Theracane Over Shoulder  - 1 x daily - 7 x weekly - 3 sets - 10 reps  ASSESSMENT:  CLINICAL IMPRESSION:  Therapy perfromed a BERG balance test on him today and checked his strength. Overall he is making progress. He hopes to be able to be more consitant with PT following the new year. He would benefit from further skilled therapy 2W8 to continue to work on balance and strengthening exercises.  Reviewed basic balance exercises.  We also increase his band of blue.  See below for goal specific progress.His strength has improved with all measurements.   EVAL: The patient is a 83 year old male whose had a decline in mobility following several medical issues.  He has been able to progress from a wheelchair to a walker to a cane.  He presents to physical therapy at this time to continue to progress his mobility.  Strength testing showed decreased strength in all muscle was used for ambulation.  He was given a basic program for strengthening and start today.  He also presents with decreased baseline balance.  We will do more extensive balance and mobility test next visit.  We are unable today to secondary to time.  He would benefit from skilled therapy to continue to progress mobility  and decrease fall risk. OBJECTIVE IMPAIRMENTS: Abnormal gait, decreased balance, decreased mobility, difficulty walking, decreased ROM, decreased strength, increased muscle spasms, postural dysfunction, and pain.   ACTIVITY LIMITATIONS: carrying, lifting, standing, squatting, stairs, transfers, and locomotion level  PARTICIPATION LIMITATIONS: meal prep, cleaning, shopping, and community activity  PERSONAL FACTORS: MI in February 2025, history of chronic low back pain are also affecting patient's functional outcome.   REHAB POTENTIAL: Good  CLINICAL DECISION MAKING: Evolving/moderate complexity  EVALUATION COMPLEXITY: Moderate   GOALS: Goals reviewed with patient? Yes  SHORT TERM GOALS: Target date: 12/21/2024     Patient will increase gross bilateral lower extremity strength by 5 pounds Baseline: Goal status: Achieved and updated new goal for 5 pounds from 12/11  2.  Patient will demonstrate good stability with eyes closed and narrow base of support Baseline:  Goal status: Continues to require standby assist 12/11  3.  Patient will report a 50% reduction in low back pain when standing Baseline:  Goal status: Back pain is improved close to 50% achieved 12/11  4.  Patient will be independent with basic HEP for balance Baseline:  Goal status: Will expand HEP 12/11   LONG TERM GOALS: Target date: 01/18/2025   ]  Patient will ambulate community distances without increased low back pain Baseline:  Goal status: INITIAL  2.  Patient will ambulate without cane if safe Baseline:  Goal status: INITIAL  3.  Patient will be independent with complete HEP Baseline:  Goal status: INITIAL PLAN:  PT FREQUENCY: 2x/week  PT DURATION: 8 weeks  PLANNED INTERVENTIONS: 97110-Therapeutic exercises, 97530- Therapeutic activity, [id number]- Neuromuscular re-education, 97535- Self Care, 02859- Manual therapy, [id number]- Gait training, [phone]- Aquatic Therapy, 97014- Electrical stimulation  (unattended), 97035- Ultrasound, Patient/Family education, Stair training, Taping, Dry Needling, DME instructions, Cryotherapy, and Moist heat   PLAN FOR NEXT SESSION:   Continue to progress balance exercises.  Advance HEP for home.  Patient only has 3 exercises at this time.  Advance him to a standing series when tolerated consider supine series as well  Alm JINNY Don, PT 11/23/2024, 10:58 AM

## 2024-11-22 NOTE — Telephone Encounter (Signed)
 Called and spoke to pt's wife Daralee to confirm/remind patient of their appointment at the [facility] on 11/23/24.   Appointment:   [x] Confirmed  [] Left mess   [] No answer/No voice mail  [] VM Full/unable to leave message  [] Phone not in service  Patient reminded to bring all medications and/or complete list.  Confirmed patient has transportation. Gave directions, instructed to utilize valet parking.

## 2024-11-23 ENCOUNTER — Ambulatory Visit (HOSPITAL_COMMUNITY)
Admission: RE | Admit: 2024-11-23 | Discharge: 2024-11-23 | Disposition: A | Source: Ambulatory Visit | Attending: Family Medicine

## 2024-11-23 ENCOUNTER — Encounter (HOSPITAL_BASED_OUTPATIENT_CLINIC_OR_DEPARTMENT_OTHER): Payer: Self-pay | Admitting: Physical Therapy

## 2024-11-23 ENCOUNTER — Ambulatory Visit (HOSPITAL_COMMUNITY): Payer: Self-pay | Admitting: Family Medicine

## 2024-11-23 ENCOUNTER — Encounter (HOSPITAL_COMMUNITY): Payer: Self-pay

## 2024-11-23 VITALS — BP 110/52 | HR 58 | Ht 70.0 in | Wt 208.2 lb

## 2024-11-23 DIAGNOSIS — I442 Atrioventricular block, complete: Secondary | ICD-10-CM

## 2024-11-23 DIAGNOSIS — I480 Paroxysmal atrial fibrillation: Secondary | ICD-10-CM

## 2024-11-23 DIAGNOSIS — Z86718 Personal history of other venous thrombosis and embolism: Secondary | ICD-10-CM

## 2024-11-23 DIAGNOSIS — I2510 Atherosclerotic heart disease of native coronary artery without angina pectoris: Secondary | ICD-10-CM | POA: Diagnosis not present

## 2024-11-23 DIAGNOSIS — I5022 Chronic systolic (congestive) heart failure: Secondary | ICD-10-CM | POA: Diagnosis not present

## 2024-11-23 LAB — BASIC METABOLIC PANEL WITH GFR
Anion gap: 12 (ref 5–15)
BUN: 46 mg/dL — ABNORMAL HIGH (ref 8–23)
CO2: 22 mmol/L (ref 22–32)
Calcium: 8.6 mg/dL — ABNORMAL LOW (ref 8.9–10.3)
Chloride: 102 mmol/L (ref 98–111)
Creatinine, Ser: 1.22 mg/dL (ref 0.61–1.24)
GFR, Estimated: 60 mL/min — ABNORMAL LOW (ref >60)
Glucose, Bld: 101 mg/dL — ABNORMAL HIGH (ref 70–99)
Potassium: 4.3 mmol/L (ref 3.5–5.1)
Sodium: 136 mmol/L (ref 135–145)

## 2024-11-23 LAB — BRAIN NATRIURETIC PEPTIDE: B Natriuretic Peptide: 162.5 pg/mL — ABNORMAL HIGH (ref 0.0–100.0)

## 2024-11-23 MED ORDER — EZETIMIBE 10 MG PO TABS: 10.0000 mg | ORAL_TABLET | Freq: Every day | ORAL | 3 refills | Status: AC

## 2024-11-23 NOTE — Telephone Encounter (Signed)
 Patient scheduled for next available with provider.

## 2024-11-23 NOTE — Patient Instructions (Signed)
 There has been no changes to your medications.  Labs done today, your results will be available in MyChart, we will contact you for abnormal readings.  Your physician recommends that you schedule a follow-up appointment in: 4 months ( April 2026) ** PLEASE CALL THE OFFICE IN FEBRUARY TO ARRANGE YOUR FOLLOW UP APPOINTMENT.**  If you have any questions or concerns before your next appointment please send us  a message through [place] or call our office at [phone].    TO LEAVE A MESSAGE FOR THE NURSE SELECT OPTION 2, PLEASE LEAVE A MESSAGE INCLUDING: YOUR NAME DATE OF BIRTH CALL BACK NUMBER REASON FOR CALL**this is important as we prioritize the call backs  YOU WILL RECEIVE A CALL BACK THE SAME DAY AS LONG AS YOU CALL BEFORE 4:00 PM  At the [facility], you and your health needs are our priority. As part of our continuing mission to provide you with exceptional heart care, we have created designated Provider Care Teams. These Care Teams include your primary Cardiologist (physician) and Advanced Practice Providers (APPs- Physician Assistants and Nurse Practitioners) who all work together to provide you with the care you need, when you need it.   You may see any of the following providers on your designated Care Team at your next follow up: Dr Toribio Fuel Dr Ezra Shuck Dr. Morene Brownie Greig Mosses, NP Caffie Shed, [place] [facility] [place], [place] Beckey Coe, NP Jordan Lee, NP Ellouise Class, NP Tinnie Redman, PharmD Jaun Bash, PharmD   Please be sure to bring in all your medications bottles to every appointment.    Thank you for choosing [facility]

## 2024-11-26 ENCOUNTER — Encounter (HOSPITAL_BASED_OUTPATIENT_CLINIC_OR_DEPARTMENT_OTHER): Admitting: Physical Therapy

## 2024-11-30 ENCOUNTER — Other Ambulatory Visit: Payer: Self-pay

## 2024-11-30 DIAGNOSIS — K50919 Crohn's disease, unspecified, with unspecified complications: Secondary | ICD-10-CM

## 2024-11-30 NOTE — Telephone Encounter (Signed)
 Lab order has been entered  MRI order has been entered and sent to the schedulers

## 2024-11-30 NOTE — Telephone Encounter (Signed)
 Patty, I have received update from the patient's PCP. Please move forward with scheduling CA 19-9 level to be drawn at our lab at [facility]. Please move forward with scheduling MRI/MRCP next 1 to 2 months as able. Patient does have a pacemaker in place, but has had an MRI/MRCP done elsewhere and has tolerated this. Please put this in the notes, so that radiology is aware.  Reading through the cardiology note it looks like this is a Environmental Manager CRT-D device. Please keep the appointment as he has scheduled would like to have these done before his appointment in February. Thanks. GM

## 2024-11-30 NOTE — Telephone Encounter (Signed)
 The patient has been notified of this information and all questions answered.

## 2024-12-14 ENCOUNTER — Encounter: Payer: Self-pay | Admitting: Cardiology

## 2024-12-17 ENCOUNTER — Encounter

## 2024-12-20 ENCOUNTER — Other Ambulatory Visit: Payer: Self-pay

## 2024-12-22 ENCOUNTER — Other Ambulatory Visit (HOSPITAL_BASED_OUTPATIENT_CLINIC_OR_DEPARTMENT_OTHER): Payer: Self-pay

## 2024-12-22 ENCOUNTER — Other Ambulatory Visit: Payer: Self-pay | Admitting: Family Medicine

## 2024-12-22 MED ORDER — ESOMEPRAZOLE MAGNESIUM 40 MG PO CPDR
40.0000 mg | DELAYED_RELEASE_CAPSULE | Freq: Every day | ORAL | 0 refills | Status: AC
  Filled 2024-12-22: qty 90, 90d supply, fill #0

## 2024-12-24 ENCOUNTER — Other Ambulatory Visit (HOSPITAL_BASED_OUTPATIENT_CLINIC_OR_DEPARTMENT_OTHER): Payer: Self-pay

## 2024-12-25 ENCOUNTER — Ambulatory Visit: Admitting: Pulmonary Disease

## 2024-12-26 ENCOUNTER — Telehealth: Payer: Self-pay | Admitting: Cardiology

## 2024-12-26 ENCOUNTER — Other Ambulatory Visit (HOSPITAL_BASED_OUTPATIENT_CLINIC_OR_DEPARTMENT_OTHER): Payer: Self-pay

## 2024-12-26 ENCOUNTER — Other Ambulatory Visit: Payer: Self-pay

## 2024-12-26 NOTE — Telephone Encounter (Signed)
" °*  STAT* If patient is at the pharmacy, call can be transferred to refill team.   1. Which medications need to be refilled? (please list name of each medication and dose if known)   ENTRESTO 24-26 MG    2. Which pharmacy/location (including street and city if local pharmacy) is medication to be sent to? [facility] - [place], [place] - [address]   3. Do they need a 30 day or 90 day supply? 90  "

## 2024-12-27 ENCOUNTER — Encounter (HOSPITAL_BASED_OUTPATIENT_CLINIC_OR_DEPARTMENT_OTHER): Admitting: Physical Therapy

## 2025-01-02 MED ORDER — ENTRESTO 24-26 MG PO TABS: 1.0000 | ORAL_TABLET | Freq: Two times a day (BID) | ORAL | 3 refills | Status: DC

## 2025-01-02 NOTE — Telephone Encounter (Signed)
 Refill sent

## 2025-01-03 ENCOUNTER — Ambulatory Visit

## 2025-01-03 DIAGNOSIS — I255 Ischemic cardiomyopathy: Secondary | ICD-10-CM

## 2025-01-03 LAB — CUP PACEART REMOTE DEVICE CHECK
Battery Remaining Longevity: 72 mo
Battery Remaining Percentage: 82 %
Brady Statistic RA Percent Paced: 97 %
Brady Statistic RV Percent Paced: 100 %
Date Time Interrogation Session: 20260122000100
HighPow Impedance: 93 Ohm
Implantable Lead Connection Status: 753985
Implantable Lead Connection Status: 753985
Implantable Lead Connection Status: 753985
Implantable Lead Implant Date: 20250128
Implantable Lead Implant Date: 20250128
Implantable Lead Implant Date: 20250128
Implantable Lead Location: 753858
Implantable Lead Location: 753859
Implantable Lead Location: 753860
Implantable Lead Model: 4671
Implantable Lead Model: 673
Implantable Lead Model: 7841
Implantable Lead Serial Number: 1532553
Implantable Lead Serial Number: 257585
Implantable Lead Serial Number: 893656
Implantable Pulse Generator Implant Date: 20250128
Lead Channel Impedance Value: 580 Ohm
Lead Channel Impedance Value: 590 Ohm
Lead Channel Impedance Value: 831 Ohm
Lead Channel Pacing Threshold Amplitude: 0.5 V
Lead Channel Pacing Threshold Amplitude: 4 V
Lead Channel Pacing Threshold Pulse Width: 0.4 ms
Lead Channel Pacing Threshold Pulse Width: 1 ms
Lead Channel Setting Pacing Amplitude: 2 V
Lead Channel Setting Pacing Amplitude: 2 V
Lead Channel Setting Pacing Amplitude: 4.5 V
Lead Channel Setting Pacing Pulse Width: 0.4 ms
Lead Channel Setting Pacing Pulse Width: 1 ms
Lead Channel Setting Sensing Sensitivity: 0.5 mV
Lead Channel Setting Sensing Sensitivity: 1 mV
Pulse Gen Serial Number: 352446

## 2025-01-04 ENCOUNTER — Other Ambulatory Visit (HOSPITAL_COMMUNITY): Payer: Self-pay

## 2025-01-04 ENCOUNTER — Telehealth (HOSPITAL_COMMUNITY): Payer: Self-pay

## 2025-01-04 NOTE — Telephone Encounter (Signed)
 [facility] Patient Advocate Encounter  Test billing for this patient's current coverage ([employer]) returns a $21 copay for 90 day supply of Sacubitril-Valsartan.  No prior authorization needed at this time.  This test claim was processed through [facility]- copay amounts may vary at other pharmacies due to pharmacy/plan contracts, or as the patient moves through the different stages of their insurance plan.  Rachel DEL, CPhT Rx Patient Advocate Phone: [phone]

## 2025-01-05 NOTE — Progress Notes (Signed)
 Remote ICD Transmission

## 2025-01-07 ENCOUNTER — Ambulatory Visit: Payer: Self-pay | Admitting: Cardiology

## 2025-01-09 ENCOUNTER — Encounter: Payer: Self-pay | Admitting: Cardiology

## 2025-01-09 MED ORDER — ENTRESTO 24-26 MG PO TABS: 1.0000 | ORAL_TABLET | Freq: Two times a day (BID) | ORAL | 3 refills | Status: AC

## 2025-01-14 ENCOUNTER — Other Ambulatory Visit (HOSPITAL_COMMUNITY): Payer: Self-pay

## 2025-01-16 ENCOUNTER — Ambulatory Visit: Admitting: Gastroenterology

## 2025-01-16 ENCOUNTER — Other Ambulatory Visit (HOSPITAL_BASED_OUTPATIENT_CLINIC_OR_DEPARTMENT_OTHER): Payer: Self-pay

## 2025-01-18 ENCOUNTER — Other Ambulatory Visit: Payer: Self-pay

## 2025-01-18 ENCOUNTER — Other Ambulatory Visit (HOSPITAL_BASED_OUTPATIENT_CLINIC_OR_DEPARTMENT_OTHER): Payer: Self-pay

## 2025-01-22 ENCOUNTER — Ambulatory Visit: Admitting: Gastroenterology

## 2025-01-28 ENCOUNTER — Ambulatory Visit: Admitting: Family Medicine

## 2025-02-05 ENCOUNTER — Other Ambulatory Visit (HOSPITAL_COMMUNITY)

## 2025-02-14 ENCOUNTER — Encounter

## 2025-02-26 ENCOUNTER — Ambulatory Visit: Admitting: Family Medicine

## 2025-02-27 ENCOUNTER — Ambulatory Visit: Admitting: Pulmonary Disease

## 2025-03-06 ENCOUNTER — Ambulatory Visit: Admitting: Pulmonary Disease

## 2025-04-04 ENCOUNTER — Encounter

## 2025-07-04 ENCOUNTER — Encounter

## 2025-09-10 ENCOUNTER — Ambulatory Visit

## 2025-10-03 ENCOUNTER — Encounter

## 2026-01-02 ENCOUNTER — Encounter

## 2026-04-03 ENCOUNTER — Encounter
